# Patient Record
Sex: Male | Born: 1965
Health system: Southern US, Community
[De-identification: ages and names within clinical notes are randomized; demographics above are authoritative.]

## PROBLEM LIST (undated history)

## (undated) DIAGNOSIS — Z8679 Personal history of other diseases of the circulatory system: Secondary | ICD-10-CM

## (undated) DIAGNOSIS — I2585 Chronic coronary microvascular dysfunction: Secondary | ICD-10-CM

## (undated) DIAGNOSIS — E119 Type 2 diabetes mellitus without complications: Secondary | ICD-10-CM

## (undated) DIAGNOSIS — I10 Essential (primary) hypertension: Secondary | ICD-10-CM

## (undated) DIAGNOSIS — G459 Transient cerebral ischemic attack, unspecified: Secondary | ICD-10-CM

## (undated) HISTORY — PX: NECK SURGERY: SHX720

## (undated) HISTORY — DX: Type 2 diabetes mellitus without complications: E11.9

## (undated) HISTORY — PX: BRAIN SURGERY: SHX531

## (undated) HISTORY — DX: Personal history of other diseases of the circulatory system: Z86.79

## (undated) HISTORY — DX: Chronic coronary microvascular dysfunction: I25.85

## (undated) HISTORY — PX: ARTHROPLASTY: SHX135

## (undated) HISTORY — DX: Transient cerebral ischemic attack, unspecified: G45.9

## (undated) HISTORY — PX: ROTATOR CUFF REPAIR: SHX139

---

## 1998-04-06 ENCOUNTER — Encounter: Payer: Self-pay | Admitting: Neurosurgery

## 1998-04-06 ENCOUNTER — Ambulatory Visit (HOSPITAL_COMMUNITY): Admission: RE | Admit: 1998-04-06 | Discharge: 1998-04-06 | Payer: Self-pay | Admitting: Neurosurgery

## 1998-04-10 ENCOUNTER — Encounter: Payer: Self-pay | Admitting: Neurosurgery

## 1998-04-10 ENCOUNTER — Ambulatory Visit (HOSPITAL_COMMUNITY): Admission: RE | Admit: 1998-04-10 | Discharge: 1998-04-10 | Payer: Self-pay | Admitting: Neurosurgery

## 1998-04-18 ENCOUNTER — Inpatient Hospital Stay (HOSPITAL_COMMUNITY): Admission: RE | Admit: 1998-04-18 | Discharge: 1998-04-20 | Payer: Self-pay | Admitting: Neurosurgery

## 1998-05-14 ENCOUNTER — Ambulatory Visit (HOSPITAL_COMMUNITY): Admission: RE | Admit: 1998-05-14 | Discharge: 1998-05-14 | Payer: Self-pay | Admitting: Neurosurgery

## 1998-05-14 ENCOUNTER — Encounter: Payer: Self-pay | Admitting: Neurosurgery

## 2000-09-25 ENCOUNTER — Ambulatory Visit (HOSPITAL_COMMUNITY): Admission: RE | Admit: 2000-09-25 | Discharge: 2000-09-25 | Payer: Self-pay | Admitting: Internal Medicine

## 2000-09-25 ENCOUNTER — Encounter (INDEPENDENT_AMBULATORY_CARE_PROVIDER_SITE_OTHER): Payer: Self-pay | Admitting: Specialist

## 2002-09-23 ENCOUNTER — Encounter (INDEPENDENT_AMBULATORY_CARE_PROVIDER_SITE_OTHER): Payer: Self-pay | Admitting: Specialist

## 2002-09-23 ENCOUNTER — Ambulatory Visit (HOSPITAL_COMMUNITY): Admission: RE | Admit: 2002-09-23 | Discharge: 2002-09-23 | Payer: Self-pay | Admitting: General Surgery

## 2005-03-11 ENCOUNTER — Emergency Department (HOSPITAL_COMMUNITY): Admission: EM | Admit: 2005-03-11 | Discharge: 2005-03-11 | Payer: Self-pay | Admitting: Emergency Medicine

## 2005-10-29 ENCOUNTER — Ambulatory Visit (HOSPITAL_COMMUNITY): Admission: RE | Admit: 2005-10-29 | Discharge: 2005-10-30 | Payer: Self-pay | Admitting: Neurosurgery

## 2008-10-30 ENCOUNTER — Encounter: Admission: RE | Admit: 2008-10-30 | Discharge: 2008-10-30 | Payer: Self-pay | Admitting: Internal Medicine

## 2010-06-28 NOTE — Op Note (Signed)
NAME:  Clarence Larson, Clarence Larson                       ACCOUNT NO.:  [id number]   MEDICAL RECORD NO.:  [id number]                   PATIENT TYPE:  AMB   LOCATION:  DAY                                  FACILITY:  [facility]   PHYSICIAN:  Timothy E. Earlene Plater, M.D.              DATE OF BIRTH:  12/16/1965   DATE OF PROCEDURE:  09/23/2002  DATE OF DISCHARGE:                                 OPERATIVE REPORT   PREOPERATIVE DIAGNOSIS:  Internal hemorrhoids with prolapse.   POSTOPERATIVE DIAGNOSIS:  Internal hemorrhoids with prolapse.   PROCEDURE:  TPH hemorrhoidectomy.   SURGEON:  Timothy E. Earlene Plater, M.D.   ANESTHESIA:  General.   INDICATIONS FOR PROCEDURE:  Mr. Gavia is 82.  He is a robust and athletic  male, who works at a very strenuous job.  He has had mucosal prolapse  syndrome for some years.  He has failed conservative management and after  careful and multiple consultations, he wishes to proceed with a  hemorrhoidectomy at this time.   He was identified and the permit signed, evaluated by anesthesia, laboratory  data reviewed.   DESCRIPTION OF PROCEDURE:  He was taken to the operating room and on the  gurney, general endotracheal anesthesia was induced.  He was then turned  into the prone position, very carefully positioned.  The perianal area was  inspected, prepped, and draped in the usual fashion.  Marcaine 0.25% with  epinephrine mixed 20:1 with Wydase was injected for a wide field block  around the perianal and rectal area.  The anus was then gently dilated to  the operating anoscope.  Prolapse of generous hemorrhoidal tissue was noted.  With scope in place and the anal verge, dentate line carefully measured, a  pursestring suture of 2-0 Prolene was placed between 4-5 cm proximal to the  dentate line.  The anoscope was removed.  This pursestring suture was then  checked with the finger, and then the PPH anoscope was inserted and through  that the open anvil of the PPH stapler was  inserted above the pursestring  suture.  The pursestring suture was snugged up and tied and then brought  through the chamber of the shaft of the PPH staple device.  Then, the PPH  staple device was gently closed completely.  This was held for 60 seconds,  and then it was fired and held for 60 seconds, and then the anvil was opened  one-half turn, and the entire apparatus was removed.  Three minutes was  allowed to elapse, and then careful evaluation of the suture line was  accomplished.  Minor bleeding was seen in 1-2 locations.  One suture was  applied in the right anterior area.  The suture line was satisfactory.  It  was a little lower or closer to the dentate line than I would have expected,  but it was satisfactory.  Five minutes later, all areas were checked for  bleeding  again, and none was present.  With this, the procedure was  complete.  Gelfoam gauze was placed in the anal canal and a dry sterile  dressing externally.  He was awakened, turned back into the supine position,  and then extubated and then taken to recovery.   Careful written and verbal instructions, including Percocet #36 were given,  and he will be seen and followed as an outpatient.                                               Timothy E. Earlene Plater, M.D.    TED/MEDQ  D:  09/23/2002  T:  09/23/2002  Job:  [id number]

## 2010-06-28 NOTE — Procedures (Signed)
[facility]  Patient:    Clarence Larson, Clarence Larson                                Visit Number: [id number] MRN: [id number]          Service Type: END Location: ENDO Attending:  Mervin Hack Adm. Date:  [id number]                             Procedure Report  PROCEDURE:  Colonoscopy and destruction of hemorrhoids.  ENDOSCOPIST:  Hedwig Morton. Juanda Chance, M.D.  INDICATIONS:  This 45 year old white male has a history of colon polyps and in 1993, he also had IRC treatment on hemorrhoids on two different occasions.  He had another colonoscopy in 1998.  He is now undergoing colonoscopy with [place] because he changed insurance.  Patient has intermittent rectal bleeding.  He is interested in having his hemorrhoids treated again because of irritation and bleeding.  ENDOSCOPE:  Olympus single-channel video endoscope.  SEDATION:  Versed 10 mg IV, Demerol 100 mg IV.  FINDINGS:  Olympus single-channel video endoscope passed under direct vision into the rectum to the sigmoid colon.  Patient was monitored by a pulse oximeter; his oxygen saturations were normal.  His prep was excellent.  Anal canal showed large internal hemorrhoids, at least four large bluish hemorrhoids were partially prolapsing through the rectum.  Anal canal itself was normal.  There were scattered diverticula of the sigmoid colon.  At the level of 50 cm from the rectum was a tiny polyp measuring about 5 mm in diameter; it was ablated with hot biopsies.  The transverse colon, hepatic flexure and ascending colon were normal all the way to the cecum.  Cecal pouch and ileocecal valve were unremarkable.  Colonoscope was then retracted and the colon decompressed.  Patient tolerated the procedure well.  As the procedure was terminated, endoscope was then reintroduced into the rectum with a band ligator and four hemorrhoids were banded in the rectum with minimal bleeding and minimal discomfort.  IMPRESSION: 1.  Left colon polyp, status post ablation. 2. Diverticulosis of the left colon. 3. Internal hemorrhoids, status post destruction by banding.  PLAN: 1. Anusol-HC suppositories. 2. High fiber diet. 3. Await results of polyp histology.  If adenomatous, I suggest repeat    colonoscopy in three years. DD:  09/25/00 TD:  09/25/00 Job: [id number]

## 2010-06-28 NOTE — Op Note (Signed)
NAMEGOODWIN, KAMPHAUS             ACCOUNT NO.:  [id number]   MEDICAL RECORD NO.:  [id number]          PATIENT TYPE:  AMB   LOCATION:  SDS                          FACILITY:  [facility]   PHYSICIAN:  Henry A. Pool, M.D.    DATE OF BIRTH:  1965/09/05   DATE OF PROCEDURE:  10/29/2005  DATE OF DISCHARGE:                                 OPERATIVE REPORT   PREOPERATIVE DIAGNOSIS:  C3-4, C4-5, C5-6 and C6-7 spondylosis with stenosis  and myelopathy.   POSTOPERATIVE DIAGNOSIS:  C3-4, C4-5, C5-6 and C6-7 spondylosis with  stenosis and myelopathy.   PROCEDURE:  C3-4, C4-5, C5-6 and C6-7 anterior cervical diskectomy and  fusion with allograft anterior plating.   SURGEON:  Kathaleen Maser. Pool, M.D.   ASSISTANT:  Donalee Citrin, M.D.   ANESTHESIA:  General orotracheal.   INDICATIONS FOR PROCEDURE:  Mr. Batta is a 45 year old male with history  of severe neck and upper extremity symptoms, left much greater than right.  Workups demonstrate evidence of very severe multilevel spondylitic disease  complicated by central disk herniations at C5-6 and C6-7 with spinal cord  compression.  The patient has been counseled as to his options.  He decided  to proceed with a four level anterior cervical diskectomy and fusion with  allograft anterior plating in hopes of improving his symptoms.   DESCRIPTION OF PROCEDURE:  Patient taken to the operating room and placed on  the table in supine position.  After adequate level anesthesia achieved, the  patient with his neck slightly extended and held in place with halter  traction.  The patient's anterior cervical region is prepped and draped  sterilely.  A 10 blade was used to make a linear incision overlying the C4-5  level.  This carried sharply to the platysma.  Platysma then divided  vertically and dissection proceeded on medial border of the  sternocleidomastoid muscle and carotid sheath.  Trachea and esophagus were  mobilized and tracked towards the left.   Prevertebral fascia stripped off  the anterior spinal column.  Longus colli muscle then elevated bilaterally.  Deep self-retaining retractor was placed.  Intraoperative fluoroscopy was  used and the C3-4, C4-5, C5-6 at C6-7 levels were confirmed.  Each disk  space then incised with 15 blade in a rectangular fashion.  A wide disc  space cleanout was then achieved using pituitary rongeurs.  Forward and back  biting Carlen curettes, __________high-speed drill.  Anterior osteophytes  were removed using Leksell rongeur.  Microscope was then brought into the  field and used throughout the remainder of the diskectomy.  Starting first  at C5-6 remaining aspects of annulus and osteophytes were removed using the  high-speed drill down to the level of the posterior longitudinal ligament.  Posterior longitudinal ligament was then elevated and resected in piecemeal  fashion using Kerrison rongeurs.  Underlying thecal sac was identified.  Wide central decompression then performed by undercutting the bodies of C5-  C6.  Decompression proceeded at each neural foramen.  Wide anterior  foraminotomy was then performed along the course of the exiting C6 nerve  roots bilaterally.  Findings at  this level were that of a left paracentral  disk herniation which was completely resected as well as marked spondylosis.   The procedure then repeated C3-4, C4-5 and C6-7.  Findings at C3-4 were of a  significant spondylitic spur off to the right side.  Findings at C4-5 were  of a left preforaminal disk herniation and findings at C6-7 were of a large  central disk herniation.  The wound was then irrigated with antibiotic  solution.  Gelfoam was placed topically for hemostasis which was found to be  good.  The 6 mm Life allograft wedges were placed at C3-4 and C6-7.  The 5  mm grafts were placed at C4-5 and C5-6.  An 80 mm Atlantis anterior cervical  plate was then placed over the C3-C7 levels.  This then attached under   fluoroscopic guidance using 13 mm variable angle screws, two each at all  five levels.  Locking screws then engaged at all five levels.  Final images  revealed good position of bone graft and hardware, proper operative level  and normal alignment of spine.  Wounds then irrigated with antibiotic  solution.  Gelfoam was placed for hemostasis and found to be good.  Microscope __________were removed.  Wound is then closed in typical fashion.  Steri-Strips and sterile dressing were applied.  There no apparent  complications.  The patient tolerated the procedure well and he returns to  recovery room postoperatively.           ______________________________  Kathaleen Maser Pool, M.D.     HAP/MEDQ  D:  10/29/2005  T:  10/30/2005  Job:  [id number]

## 2012-06-10 ENCOUNTER — Emergency Department (HOSPITAL_COMMUNITY)
Admission: EM | Admit: 2012-06-10 | Discharge: 2012-06-10 | Disposition: A | Discharge status: Home / Self Care | Payer: Worker's Compensation | Attending: Emergency Medicine | Admitting: Emergency Medicine

## 2012-06-10 ENCOUNTER — Emergency Department (HOSPITAL_COMMUNITY): Payer: Worker's Compensation

## 2012-06-10 DIAGNOSIS — R209 Unspecified disturbances of skin sensation: Secondary | ICD-10-CM | POA: Insufficient documentation

## 2012-06-10 DIAGNOSIS — S199XXA Unspecified injury of neck, initial encounter: Secondary | ICD-10-CM

## 2012-06-10 DIAGNOSIS — S0003XA Contusion of scalp, initial encounter: Secondary | ICD-10-CM | POA: Insufficient documentation

## 2012-06-10 DIAGNOSIS — S0993XA Unspecified injury of face, initial encounter: Secondary | ICD-10-CM | POA: Insufficient documentation

## 2012-06-10 DIAGNOSIS — Y9389 Activity, other specified: Secondary | ICD-10-CM | POA: Insufficient documentation

## 2012-06-10 DIAGNOSIS — Y9289 Other specified places as the place of occurrence of the external cause: Secondary | ICD-10-CM | POA: Insufficient documentation

## 2012-06-10 DIAGNOSIS — Y990 Civilian activity done for income or pay: Secondary | ICD-10-CM | POA: Insufficient documentation

## 2012-06-10 DIAGNOSIS — S0093XA Contusion of unspecified part of head, initial encounter: Secondary | ICD-10-CM

## 2012-06-10 DIAGNOSIS — W208XXA Other cause of strike by thrown, projected or falling object, initial encounter: Secondary | ICD-10-CM | POA: Insufficient documentation

## 2012-06-10 MED ORDER — CYCLOBENZAPRINE HCL 10 MG PO TABS: 10.0000 mg | ORAL_TABLET | Freq: Two times a day (BID) | ORAL | Status: DC | PRN

## 2012-06-10 MED ORDER — OXYCODONE-ACETAMINOPHEN 5-325 MG PO TABS: 1.0000 | ORAL_TABLET | ORAL | Status: DC | PRN

## 2012-06-10 MED ORDER — OXYCODONE-ACETAMINOPHEN 5-325 MG PO TABS
1.0000 | ORAL_TABLET | Freq: Once | ORAL | Status: AC
  Administered 2012-06-10: 1 via ORAL
  Filled 2012-06-10: qty 1

## 2012-06-10 MED ORDER — IBUPROFEN 800 MG PO TABS: 800.0000 mg | ORAL_TABLET | Freq: Three times a day (TID) | ORAL | Status: DC

## 2012-06-10 MED ORDER — ONDANSETRON 4 MG PO TBDP
4.0000 mg | ORAL_TABLET | Freq: Once | ORAL | Status: AC
  Administered 2012-06-10: 4 mg via ORAL
  Filled 2012-06-10: qty 1

## 2012-06-10 NOTE — ED Notes (Signed)
c-collar applied  

## 2012-06-10 NOTE — ED Provider Notes (Signed)
History    This chart was scribed for Marlon Pel (PA) non-physician practitioner working with Doug Sou, MD by Sofie Rower, ED Scribe. This patient was seen in room TR06C/TR06C and the patient's care was started at 3:12PM.   CSN: [id number]  Arrival date & time 06/10/12  1426   First MD Initiated Contact with Patient 06/10/12 1512      Chief Complaint  Patient presents with  . Head Injury    (Consider location/radiation/quality/duration/timing/severity/associated sxs/prior treatment) The history is provided by the patient. No language interpreter was used.    Clarence Larson is a 47 y.o. male , with C3-C7 neck surgery, who presents to the Emergency Department complaining of sudden, moderate, head injury, onset today (06/10/12).  Associated symptoms include diffuse neck pain and numbness located at the LUE. The pt reports he was at his place of employment ([employer]) earlier today, where he was unloading a package truck and the rear door of the truck, suddenly fell, impacting directly upon the back of his head. The pt informs the height of the door before it fell was at approximately 20 feet, however, there was no LOC during the incident. The pt was able to ambulate at the scene of the incident. The pt has had a c collar applied in triage which provides moderate relief of the neck pain associated with the head injury, in addition to taking ibuprofen PTA, which does not provide relief of the neck pain.   The pt denies visual disturbance, ataxic gait, LOC, and vomiting.      No past medical history on file.  No past surgical history on file.  No family history on file.  History  Substance Use Topics  . Smoking status: Not on file  . Smokeless tobacco: Not on file  . Alcohol Use: Not on file      Review of Systems  HENT: Positive for neck pain.   Eyes: Negative for visual disturbance.  Gastrointestinal: Negative for vomiting.  Musculoskeletal: Positive for arthralgias.  Negative for gait problem.  Neurological: Negative for syncope.  All other systems reviewed and are negative.    Allergies  Hydrocodone  Home Medications   Current Outpatient Rx  Name  Route  Sig  Dispense  Refill  . cyclobenzaprine (FLEXERIL) 10 MG tablet   Oral   Take 1 tablet (10 mg total) by mouth 2 (two) times daily as needed for muscle spasms.   20 tablet   0   . ibuprofen (ADVIL,MOTRIN) 800 MG tablet   Oral   Take 1 tablet (800 mg total) by mouth 3 (three) times daily.   21 tablet   0   . oxyCODONE-acetaminophen (PERCOCET/ROXICET) 5-325 MG per tablet   Oral   Take 1 tablet by mouth every 4 (four) hours as needed for pain.   20 tablet   0     BP 144/95  Pulse 95  Temp(Src) 97.8 F (36.6 C) (Oral)  Resp 20  SpO2 100%  Physical Exam  Nursing note and vitals reviewed. Constitutional: He is oriented to person, place, and time. He appears well-developed and well-nourished. No distress.  HENT:  Head: Normocephalic. Head is with contusion. Head is without raccoon's eyes, without Battle's sign, without abrasion, without laceration, without right periorbital erythema and without left periorbital erythema. Hair is normal.  Eyes: EOM are normal.  Neck: Trachea normal. Neck supple. Spinous process tenderness and muscular tenderness present. No rigidity. Decreased range of motion present. No tracheal deviation, no edema and no  erythema present.  Cardiovascular: Normal rate.   Pulmonary/Chest: Effort normal. No respiratory distress.  Neurological: He is alert and oriented to person, place, and time.  Skin: Skin is warm and dry.  Psychiatric: He has a normal mood and affect. His behavior is normal.    ED Course  Procedures (including critical care time)  DIAGNOSTIC STUDIES: Oxygen Saturation is 100% on room air, normal by my interpretation.    COORDINATION OF CARE:  3:24 PM- Treatment plan discussed with patient. Pt agrees with treatment.   ED Medications given  for this visit (06/10/12):  Medications  oxyCODONE-acetaminophen (PERCOCET/ROXICET) 5-325 MG per tablet 1 tablet (1 tablet Oral Given 06/10/12 1544)  ondansetron (ZOFRAN-ODT) disintegrating tablet 4 mg (4 mg Oral Given 06/10/12 1544)       Labs Reviewed - No data to display  No results found for this or any previous visit. Ct Head Wo Contrast  06/10/2012  *RADIOLOGY REPORT*  Clinical Data: Hand injury, neck pain  CT HEAD WITHOUT CONTRAST,CT CERVICAL SPINE WITHOUT CONTRAST  Technique:  Contiguous axial images were obtained from the base of the skull through the vertex without contrast.,Technique: Multidetector CT imaging of the cervical spine was performed. Multiplanar CT image reconstructions were also generated.  Comparison:  03/11/2005  Findings: No skull fracture is noted.  Again noted prior occipital bone craniotomy.  No intracranial hemorrhage, mass effect or midline shift.  No acute infarction.  No mass lesion is noted on this unenhanced scan. Ventricular size is stable from prior exam.  Paranasal sinuses shows mild mucosal thickening bilateral ethmoid air cells.  There is mucosal thickening left maxillary sinus.  The mastoid air cells are unremarkable.  IMPRESSION:  1.  No acute intracranial abnormality.  No significant change.  CT cervical spine without IV contrast:  Axial images of the cervical spine shows no acute fracture or subluxation.  There is no pneumothorax in visualized lung apices.  Computer processed images shows anterior metallic fixation plate from Z6-X0 C5-C6 and C7 level.  No prevertebral soft tissue swelling. Cervical airway is patent.  Spinal canal is patent.  C1-C2 relationship is unremarkable.  Impression: 1.  Postsurgical changes C3-C7 level.  No acute fracture or subluxation.   Original Report Authenticated By: Natasha Mead, M.D.    Ct Cervical Spine Wo Contrast  06/10/2012  *RADIOLOGY REPORT*  Clinical Data: Hand injury, neck pain  CT HEAD WITHOUT CONTRAST,CT CERVICAL SPINE  WITHOUT CONTRAST  Technique:  Contiguous axial images were obtained from the base of the skull through the vertex without contrast.,Technique: Multidetector CT imaging of the cervical spine was performed. Multiplanar CT image reconstructions were also generated.  Comparison:  03/11/2005  Findings: No skull fracture is noted.  Again noted prior occipital bone craniotomy.  No intracranial hemorrhage, mass effect or midline shift.  No acute infarction.  No mass lesion is noted on this unenhanced scan. Ventricular size is stable from prior exam.  Paranasal sinuses shows mild mucosal thickening bilateral ethmoid air cells.  There is mucosal thickening left maxillary sinus.  The mastoid air cells are unremarkable.  IMPRESSION:  1.  No acute intracranial abnormality.  No significant change.  CT cervical spine without IV contrast:  Axial images of the cervical spine shows no acute fracture or subluxation.  There is no pneumothorax in visualized lung apices.  Computer processed images shows anterior metallic fixation plate from R6-E4 C5-C6 and C7 level.  No prevertebral soft tissue swelling. Cervical airway is patent.  Spinal canal is patent.  C1-C2 relationship  is unremarkable.  Impression: 1.  Postsurgical changes C3-C7 level.  No acute fracture or subluxation.   Original Report Authenticated By: Natasha Mead, M.D.     5:04 PM- Recheck. Treatment plan concerning radiology results and follow up with Neurologist discussed with patient. Pt agrees with treatment.    Discharge orders:    Medication List    TAKE these medications       cyclobenzaprine 10 MG tablet  Commonly known as:  FLEXERIL  Take 1 tablet (10 mg total) by mouth 2 (two) times daily as needed for muscle spasms.     ibuprofen 800 MG tablet  Commonly known as:  ADVIL,MOTRIN  Take 1 tablet (800 mg total) by mouth 3 (three) times daily.     oxyCODONE-acetaminophen 5-325 MG per tablet  Commonly known as:  PERCOCET/ROXICET  Take 1 tablet by mouth  every 4 (four) hours as needed for pain.         1. Head contusion, initial encounter   2. Neck injury, initial encounter       MDM  Due to patients past medical history I stressed need to follow-up with neurologist. 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  Pt has been advised of the symptoms that warrant their return to the ED. Patient has voiced understanding and has agreed to follow-up with the PCP or specialist.  I personally performed the services described in this documentation, which was scribed in my presence. The recorded information has been reviewed and is accurate.   Dorthula Matas, PA-C 06/10/12 2359

## 2012-06-10 NOTE — ED Notes (Signed)
No loc but stunned him bad

## 2012-06-10 NOTE — ED Notes (Signed)
Works for [employer] and was loading truck and door fell hitting pt on top of head now having pain in neck left shoulder going down back c collar appied at triage

## 2012-06-11 NOTE — ED Provider Notes (Signed)
Medical screening examination/treatment/procedure(s) were performed by non-physician practitioner and as supervising physician I was immediately available for consultation/collaboration.  Deairra Halleck, MD 06/11/12 0116 

## 2014-05-18 ENCOUNTER — Institutional Professional Consult (permissible substitution): Payer: Self-pay | Admitting: Neurology

## 2016-08-01 DIAGNOSIS — E291 Testicular hypofunction: Secondary | ICD-10-CM | POA: Diagnosis not present

## 2016-08-01 DIAGNOSIS — E7800 Pure hypercholesterolemia, unspecified: Secondary | ICD-10-CM | POA: Diagnosis not present

## 2016-08-01 DIAGNOSIS — Z79899 Other long term (current) drug therapy: Secondary | ICD-10-CM | POA: Diagnosis not present

## 2016-08-01 DIAGNOSIS — Z125 Encounter for screening for malignant neoplasm of prostate: Secondary | ICD-10-CM | POA: Diagnosis not present

## 2016-08-04 DIAGNOSIS — E669 Obesity, unspecified: Secondary | ICD-10-CM | POA: Diagnosis not present

## 2016-08-04 DIAGNOSIS — F901 Attention-deficit hyperactivity disorder, predominantly hyperactive type: Secondary | ICD-10-CM | POA: Diagnosis not present

## 2016-08-04 DIAGNOSIS — I10 Essential (primary) hypertension: Secondary | ICD-10-CM | POA: Diagnosis not present

## 2016-08-04 DIAGNOSIS — E7800 Pure hypercholesterolemia, unspecified: Secondary | ICD-10-CM | POA: Diagnosis not present

## 2016-08-04 DIAGNOSIS — Z6834 Body mass index (BMI) 34.0-34.9, adult: Secondary | ICD-10-CM | POA: Diagnosis not present

## 2016-08-04 DIAGNOSIS — K219 Gastro-esophageal reflux disease without esophagitis: Secondary | ICD-10-CM | POA: Diagnosis not present

## 2016-08-04 DIAGNOSIS — F5101 Primary insomnia: Secondary | ICD-10-CM | POA: Diagnosis not present

## 2016-08-04 DIAGNOSIS — E291 Testicular hypofunction: Secondary | ICD-10-CM | POA: Diagnosis not present

## 2016-08-12 DIAGNOSIS — L84 Corns and callosities: Secondary | ICD-10-CM | POA: Diagnosis not present

## 2016-09-01 DIAGNOSIS — G894 Chronic pain syndrome: Secondary | ICD-10-CM | POA: Diagnosis not present

## 2016-09-01 DIAGNOSIS — M542 Cervicalgia: Secondary | ICD-10-CM | POA: Diagnosis not present

## 2016-09-29 DIAGNOSIS — M542 Cervicalgia: Secondary | ICD-10-CM | POA: Diagnosis not present

## 2016-09-29 DIAGNOSIS — G894 Chronic pain syndrome: Secondary | ICD-10-CM | POA: Diagnosis not present

## 2016-09-29 DIAGNOSIS — Z79891 Long term (current) use of opiate analgesic: Secondary | ICD-10-CM | POA: Diagnosis not present

## 2016-09-29 DIAGNOSIS — M25511 Pain in right shoulder: Secondary | ICD-10-CM | POA: Diagnosis not present

## 2016-10-07 DIAGNOSIS — M19011 Primary osteoarthritis, right shoulder: Secondary | ICD-10-CM | POA: Diagnosis not present

## 2016-10-07 DIAGNOSIS — M064 Inflammatory polyarthropathy: Secondary | ICD-10-CM | POA: Diagnosis not present

## 2016-10-07 DIAGNOSIS — M25511 Pain in right shoulder: Secondary | ICD-10-CM | POA: Diagnosis not present

## 2016-10-10 DIAGNOSIS — K219 Gastro-esophageal reflux disease without esophagitis: Secondary | ICD-10-CM | POA: Diagnosis not present

## 2016-10-10 DIAGNOSIS — I10 Essential (primary) hypertension: Secondary | ICD-10-CM | POA: Diagnosis not present

## 2016-10-10 DIAGNOSIS — R0689 Other abnormalities of breathing: Secondary | ICD-10-CM | POA: Diagnosis not present

## 2016-10-15 DIAGNOSIS — Z0000 Encounter for general adult medical examination without abnormal findings: Secondary | ICD-10-CM | POA: Diagnosis not present

## 2016-10-15 DIAGNOSIS — E669 Obesity, unspecified: Secondary | ICD-10-CM | POA: Diagnosis not present

## 2016-10-15 DIAGNOSIS — Z6833 Body mass index (BMI) 33.0-33.9, adult: Secondary | ICD-10-CM | POA: Diagnosis not present

## 2016-10-31 DIAGNOSIS — G894 Chronic pain syndrome: Secondary | ICD-10-CM | POA: Diagnosis not present

## 2016-10-31 DIAGNOSIS — M25511 Pain in right shoulder: Secondary | ICD-10-CM | POA: Diagnosis not present

## 2016-10-31 DIAGNOSIS — Z79891 Long term (current) use of opiate analgesic: Secondary | ICD-10-CM | POA: Diagnosis not present

## 2016-10-31 DIAGNOSIS — M542 Cervicalgia: Secondary | ICD-10-CM | POA: Diagnosis not present

## 2016-11-15 DIAGNOSIS — J209 Acute bronchitis, unspecified: Secondary | ICD-10-CM | POA: Diagnosis not present

## 2016-11-15 DIAGNOSIS — R0600 Dyspnea, unspecified: Secondary | ICD-10-CM | POA: Diagnosis not present

## 2016-11-15 DIAGNOSIS — B349 Viral infection, unspecified: Secondary | ICD-10-CM | POA: Diagnosis not present

## 2016-11-15 DIAGNOSIS — R0602 Shortness of breath: Secondary | ICD-10-CM | POA: Diagnosis not present

## 2016-11-18 DIAGNOSIS — M19011 Primary osteoarthritis, right shoulder: Secondary | ICD-10-CM | POA: Diagnosis not present

## 2016-11-18 DIAGNOSIS — M25511 Pain in right shoulder: Secondary | ICD-10-CM | POA: Diagnosis not present

## 2016-11-20 DIAGNOSIS — J4540 Moderate persistent asthma, uncomplicated: Secondary | ICD-10-CM | POA: Diagnosis not present

## 2016-11-20 DIAGNOSIS — F901 Attention-deficit hyperactivity disorder, predominantly hyperactive type: Secondary | ICD-10-CM | POA: Diagnosis not present

## 2016-11-28 DIAGNOSIS — M542 Cervicalgia: Secondary | ICD-10-CM | POA: Diagnosis not present

## 2016-11-28 DIAGNOSIS — Z79891 Long term (current) use of opiate analgesic: Secondary | ICD-10-CM | POA: Diagnosis not present

## 2016-11-28 DIAGNOSIS — M25511 Pain in right shoulder: Secondary | ICD-10-CM | POA: Diagnosis not present

## 2016-11-28 DIAGNOSIS — G894 Chronic pain syndrome: Secondary | ICD-10-CM | POA: Diagnosis not present

## 2016-12-17 DIAGNOSIS — Z23 Encounter for immunization: Secondary | ICD-10-CM | POA: Diagnosis not present

## 2016-12-21 DIAGNOSIS — G894 Chronic pain syndrome: Secondary | ICD-10-CM | POA: Diagnosis not present

## 2016-12-21 DIAGNOSIS — I10 Essential (primary) hypertension: Secondary | ICD-10-CM | POA: Diagnosis not present

## 2016-12-21 DIAGNOSIS — F11920 Opioid use, unspecified with intoxication, uncomplicated: Secondary | ICD-10-CM | POA: Diagnosis not present

## 2016-12-21 DIAGNOSIS — R51 Headache: Secondary | ICD-10-CM | POA: Diagnosis not present

## 2016-12-21 DIAGNOSIS — Z79891 Long term (current) use of opiate analgesic: Secondary | ICD-10-CM | POA: Diagnosis not present

## 2016-12-22 DIAGNOSIS — F1123 Opioid dependence with withdrawal: Secondary | ICD-10-CM | POA: Diagnosis not present

## 2016-12-22 DIAGNOSIS — F19939 Other psychoactive substance use, unspecified with withdrawal, unspecified: Secondary | ICD-10-CM | POA: Diagnosis not present

## 2016-12-23 DIAGNOSIS — Z6834 Body mass index (BMI) 34.0-34.9, adult: Secondary | ICD-10-CM | POA: Diagnosis not present

## 2016-12-23 DIAGNOSIS — E669 Obesity, unspecified: Secondary | ICD-10-CM | POA: Diagnosis not present

## 2016-12-23 DIAGNOSIS — I10 Essential (primary) hypertension: Secondary | ICD-10-CM | POA: Diagnosis not present

## 2016-12-26 DIAGNOSIS — M542 Cervicalgia: Secondary | ICD-10-CM | POA: Diagnosis not present

## 2016-12-26 DIAGNOSIS — M25511 Pain in right shoulder: Secondary | ICD-10-CM | POA: Diagnosis not present

## 2016-12-26 DIAGNOSIS — G894 Chronic pain syndrome: Secondary | ICD-10-CM | POA: Diagnosis not present

## 2016-12-26 DIAGNOSIS — Z79891 Long term (current) use of opiate analgesic: Secondary | ICD-10-CM | POA: Diagnosis not present

## 2016-12-30 DIAGNOSIS — M064 Inflammatory polyarthropathy: Secondary | ICD-10-CM | POA: Diagnosis not present

## 2016-12-30 DIAGNOSIS — M19011 Primary osteoarthritis, right shoulder: Secondary | ICD-10-CM | POA: Diagnosis not present

## 2016-12-30 DIAGNOSIS — M25511 Pain in right shoulder: Secondary | ICD-10-CM | POA: Diagnosis not present

## 2017-01-07 DIAGNOSIS — I10 Essential (primary) hypertension: Secondary | ICD-10-CM | POA: Diagnosis not present

## 2017-01-07 DIAGNOSIS — J029 Acute pharyngitis, unspecified: Secondary | ICD-10-CM | POA: Diagnosis not present

## 2017-01-10 DIAGNOSIS — I10 Essential (primary) hypertension: Secondary | ICD-10-CM | POA: Diagnosis not present

## 2017-01-10 DIAGNOSIS — E7800 Pure hypercholesterolemia, unspecified: Secondary | ICD-10-CM | POA: Diagnosis not present

## 2017-01-10 DIAGNOSIS — R079 Chest pain, unspecified: Secondary | ICD-10-CM | POA: Diagnosis not present

## 2017-01-10 DIAGNOSIS — R0602 Shortness of breath: Secondary | ICD-10-CM | POA: Diagnosis not present

## 2017-01-13 DIAGNOSIS — I10 Essential (primary) hypertension: Secondary | ICD-10-CM | POA: Diagnosis not present

## 2017-01-13 DIAGNOSIS — T402X5D Adverse effect of other opioids, subsequent encounter: Secondary | ICD-10-CM | POA: Diagnosis not present

## 2017-01-13 DIAGNOSIS — M542 Cervicalgia: Secondary | ICD-10-CM | POA: Diagnosis not present

## 2017-01-28 DIAGNOSIS — M25511 Pain in right shoulder: Secondary | ICD-10-CM | POA: Diagnosis not present

## 2017-01-28 DIAGNOSIS — M542 Cervicalgia: Secondary | ICD-10-CM | POA: Diagnosis not present

## 2017-01-28 DIAGNOSIS — G894 Chronic pain syndrome: Secondary | ICD-10-CM | POA: Diagnosis not present

## 2017-01-28 DIAGNOSIS — Z79891 Long term (current) use of opiate analgesic: Secondary | ICD-10-CM | POA: Diagnosis not present

## 2017-02-12 DIAGNOSIS — S46812A Strain of other muscles, fascia and tendons at shoulder and upper arm level, left arm, initial encounter: Secondary | ICD-10-CM | POA: Diagnosis not present

## 2017-02-20 DIAGNOSIS — M24112 Other articular cartilage disorders, left shoulder: Secondary | ICD-10-CM | POA: Diagnosis not present

## 2017-02-20 DIAGNOSIS — M75112 Incomplete rotator cuff tear or rupture of left shoulder, not specified as traumatic: Secondary | ICD-10-CM | POA: Diagnosis not present

## 2017-02-20 DIAGNOSIS — M19012 Primary osteoarthritis, left shoulder: Secondary | ICD-10-CM | POA: Diagnosis not present

## 2017-02-20 DIAGNOSIS — M7582 Other shoulder lesions, left shoulder: Secondary | ICD-10-CM | POA: Diagnosis not present

## 2017-02-24 DIAGNOSIS — S46812A Strain of other muscles, fascia and tendons at shoulder and upper arm level, left arm, initial encounter: Secondary | ICD-10-CM | POA: Diagnosis not present

## 2017-03-02 DIAGNOSIS — Z79891 Long term (current) use of opiate analgesic: Secondary | ICD-10-CM | POA: Diagnosis not present

## 2017-03-02 DIAGNOSIS — M25511 Pain in right shoulder: Secondary | ICD-10-CM | POA: Diagnosis not present

## 2017-03-02 DIAGNOSIS — M542 Cervicalgia: Secondary | ICD-10-CM | POA: Diagnosis not present

## 2017-03-02 DIAGNOSIS — G894 Chronic pain syndrome: Secondary | ICD-10-CM | POA: Diagnosis not present

## 2017-03-06 DIAGNOSIS — R071 Chest pain on breathing: Secondary | ICD-10-CM | POA: Diagnosis not present

## 2017-03-06 DIAGNOSIS — R079 Chest pain, unspecified: Secondary | ICD-10-CM | POA: Diagnosis not present

## 2017-03-06 DIAGNOSIS — E871 Hypo-osmolality and hyponatremia: Secondary | ICD-10-CM | POA: Diagnosis not present

## 2017-03-06 DIAGNOSIS — R0602 Shortness of breath: Secondary | ICD-10-CM | POA: Diagnosis not present

## 2017-03-06 DIAGNOSIS — G894 Chronic pain syndrome: Secondary | ICD-10-CM | POA: Diagnosis not present

## 2017-03-06 DIAGNOSIS — S46812A Strain of other muscles, fascia and tendons at shoulder and upper arm level, left arm, initial encounter: Secondary | ICD-10-CM | POA: Diagnosis not present

## 2017-03-06 DIAGNOSIS — M75102 Unspecified rotator cuff tear or rupture of left shoulder, not specified as traumatic: Secondary | ICD-10-CM | POA: Diagnosis not present

## 2017-03-06 DIAGNOSIS — G8918 Other acute postprocedural pain: Secondary | ICD-10-CM | POA: Diagnosis not present

## 2017-03-06 DIAGNOSIS — Z87891 Personal history of nicotine dependence: Secondary | ICD-10-CM | POA: Diagnosis not present

## 2017-03-06 DIAGNOSIS — M7552 Bursitis of left shoulder: Secondary | ICD-10-CM | POA: Diagnosis not present

## 2017-03-06 DIAGNOSIS — E785 Hyperlipidemia, unspecified: Secondary | ICD-10-CM | POA: Diagnosis not present

## 2017-03-06 DIAGNOSIS — I10 Essential (primary) hypertension: Secondary | ICD-10-CM | POA: Diagnosis not present

## 2017-03-06 DIAGNOSIS — M75122 Complete rotator cuff tear or rupture of left shoulder, not specified as traumatic: Secondary | ICD-10-CM | POA: Diagnosis not present

## 2017-03-06 DIAGNOSIS — M62122 Other rupture of muscle (nontraumatic), left upper arm: Secondary | ICD-10-CM | POA: Diagnosis not present

## 2017-03-06 DIAGNOSIS — M66322 Spontaneous rupture of flexor tendons, left upper arm: Secondary | ICD-10-CM | POA: Diagnosis not present

## 2017-03-06 DIAGNOSIS — E039 Hypothyroidism, unspecified: Secondary | ICD-10-CM | POA: Diagnosis not present

## 2017-03-06 DIAGNOSIS — J986 Disorders of diaphragm: Secondary | ICD-10-CM | POA: Diagnosis not present

## 2017-03-06 DIAGNOSIS — R0781 Pleurodynia: Secondary | ICD-10-CM | POA: Diagnosis not present

## 2017-03-06 DIAGNOSIS — M7542 Impingement syndrome of left shoulder: Secondary | ICD-10-CM | POA: Diagnosis not present

## 2017-03-06 DIAGNOSIS — E7800 Pure hypercholesterolemia, unspecified: Secondary | ICD-10-CM | POA: Diagnosis not present

## 2017-03-06 DIAGNOSIS — J181 Lobar pneumonia, unspecified organism: Secondary | ICD-10-CM | POA: Diagnosis not present

## 2017-03-06 DIAGNOSIS — G8929 Other chronic pain: Secondary | ICD-10-CM | POA: Diagnosis not present

## 2017-03-06 DIAGNOSIS — Z79899 Other long term (current) drug therapy: Secondary | ICD-10-CM | POA: Diagnosis not present

## 2017-03-07 DIAGNOSIS — R0602 Shortness of breath: Secondary | ICD-10-CM | POA: Diagnosis not present

## 2017-03-07 DIAGNOSIS — M75102 Unspecified rotator cuff tear or rupture of left shoulder, not specified as traumatic: Secondary | ICD-10-CM | POA: Diagnosis not present

## 2017-03-07 DIAGNOSIS — R079 Chest pain, unspecified: Secondary | ICD-10-CM | POA: Diagnosis not present

## 2017-03-07 DIAGNOSIS — I10 Essential (primary) hypertension: Secondary | ICD-10-CM | POA: Diagnosis not present

## 2017-03-07 DIAGNOSIS — E785 Hyperlipidemia, unspecified: Secondary | ICD-10-CM | POA: Diagnosis not present

## 2017-03-07 DIAGNOSIS — E871 Hypo-osmolality and hyponatremia: Secondary | ICD-10-CM | POA: Diagnosis not present

## 2017-03-07 DIAGNOSIS — J181 Lobar pneumonia, unspecified organism: Secondary | ICD-10-CM | POA: Diagnosis not present

## 2017-03-09 DIAGNOSIS — S46812D Strain of other muscles, fascia and tendons at shoulder and upper arm level, left arm, subsequent encounter: Secondary | ICD-10-CM | POA: Diagnosis not present

## 2017-03-09 DIAGNOSIS — M25512 Pain in left shoulder: Secondary | ICD-10-CM | POA: Diagnosis not present

## 2017-03-09 DIAGNOSIS — M6281 Muscle weakness (generalized): Secondary | ICD-10-CM | POA: Diagnosis not present

## 2017-03-12 ENCOUNTER — Other Ambulatory Visit: Payer: Self-pay | Admitting: *Deleted

## 2017-03-12 DIAGNOSIS — J4540 Moderate persistent asthma, uncomplicated: Secondary | ICD-10-CM | POA: Diagnosis not present

## 2017-03-12 DIAGNOSIS — K219 Gastro-esophageal reflux disease without esophagitis: Secondary | ICD-10-CM | POA: Diagnosis not present

## 2017-03-12 DIAGNOSIS — I10 Essential (primary) hypertension: Secondary | ICD-10-CM | POA: Diagnosis not present

## 2017-03-12 DIAGNOSIS — F901 Attention-deficit hyperactivity disorder, predominantly hyperactive type: Secondary | ICD-10-CM | POA: Diagnosis not present

## 2017-03-12 DIAGNOSIS — Z79899 Other long term (current) drug therapy: Secondary | ICD-10-CM | POA: Diagnosis not present

## 2017-03-12 DIAGNOSIS — E291 Testicular hypofunction: Secondary | ICD-10-CM | POA: Diagnosis not present

## 2017-03-12 NOTE — Patient Outreach (Signed)
[facility] [facility]) Care Management  03/12/2017  Clarence Larson [birth year] [id number]  Referral from [place]; recently discharged from [facility] 03/08/2017.  Telephone to patient who was advised of reason for call & [facility] care management services. HIPPA verification received from patient.  Patient voices he had outpatient surgery on shoulder but had to be admitted because of a complication. States he had hospital follow up appointment with Primary care provider today.   Voices that he has all of prescriptions filled.  Patient voices he is having shoulder pain and also has chronic pain. Voices he goes to Pain Management clinic.   Patient agreed to answer "TOC" assessment but unable to talk further.  Plan: Will follow up.   Sherrin Daisy, RN BSN [place] Management Coordinator [facility] Care Management  [phone]

## 2017-03-13 DIAGNOSIS — S46812D Strain of other muscles, fascia and tendons at shoulder and upper arm level, left arm, subsequent encounter: Secondary | ICD-10-CM | POA: Diagnosis not present

## 2017-03-13 DIAGNOSIS — M6281 Muscle weakness (generalized): Secondary | ICD-10-CM | POA: Diagnosis not present

## 2017-03-13 DIAGNOSIS — M25512 Pain in left shoulder: Secondary | ICD-10-CM | POA: Diagnosis not present

## 2017-03-17 ENCOUNTER — Other Ambulatory Visit: Payer: Self-pay | Admitting: *Deleted

## 2017-03-17 DIAGNOSIS — S46812D Strain of other muscles, fascia and tendons at shoulder and upper arm level, left arm, subsequent encounter: Secondary | ICD-10-CM | POA: Diagnosis not present

## 2017-03-17 DIAGNOSIS — M6281 Muscle weakness (generalized): Secondary | ICD-10-CM | POA: Diagnosis not present

## 2017-03-17 DIAGNOSIS — M25512 Pain in left shoulder: Secondary | ICD-10-CM | POA: Diagnosis not present

## 2017-03-17 NOTE — Patient Outreach (Signed)
[place] [facility]) Care Management  03/17/2017  Clarence Larson 02-22-1965 [id number]  Telephone to patient who was advised of reason for call & [facility] care management services. HIPPA verification received from patient.   Return call to patient for [facility] #2 call.  No answer; left message on voice mail.   Plan: Will follow up.  Sherrin Daisy, RN BSN [place] Management Coordinator [facility] Care Management  [phone]

## 2017-03-18 ENCOUNTER — Other Ambulatory Visit: Payer: Self-pay | Admitting: *Deleted

## 2017-03-18 NOTE — Patient Outreach (Signed)
[place] [facility]) Care Management  03/18/2017  Clarence Larson 1965/12/15 [id number]   Referral from [place]; recently discharged from [facility] 03/08/2017.  Telephone call attempt x 2; patient unable to talk now. Wants call back tomorrow.   Plan: Geophysicist/field seismologist. Will follow up.  Sherrin Daisy, RN BSN [place] Management Coordinator [facility] Care Management  [phone]

## 2017-03-19 ENCOUNTER — Encounter: Payer: Self-pay | Admitting: *Deleted

## 2017-03-19 ENCOUNTER — Other Ambulatory Visit: Payer: Self-pay | Admitting: *Deleted

## 2017-03-19 NOTE — Patient Outreach (Signed)
[place] [facility]) Care Management  03/19/2017  [name] 07/11/1965 [id number]  Telephone/Transition of care call x 3 attempt- left voice message.  Plan: Will send outreach letter. Will follow up.  Sherrin Daisy, RN BSN [place] Management Coordinator [facility] Care Management  [phone]

## 2017-03-20 DIAGNOSIS — M25512 Pain in left shoulder: Secondary | ICD-10-CM | POA: Diagnosis not present

## 2017-03-20 DIAGNOSIS — M6281 Muscle weakness (generalized): Secondary | ICD-10-CM | POA: Diagnosis not present

## 2017-03-20 DIAGNOSIS — S46812D Strain of other muscles, fascia and tendons at shoulder and upper arm level, left arm, subsequent encounter: Secondary | ICD-10-CM | POA: Diagnosis not present

## 2017-03-24 DIAGNOSIS — Z79899 Other long term (current) drug therapy: Secondary | ICD-10-CM | POA: Diagnosis not present

## 2017-03-24 DIAGNOSIS — I10 Essential (primary) hypertension: Secondary | ICD-10-CM | POA: Diagnosis not present

## 2017-03-25 DIAGNOSIS — M6281 Muscle weakness (generalized): Secondary | ICD-10-CM | POA: Diagnosis not present

## 2017-03-25 DIAGNOSIS — M25512 Pain in left shoulder: Secondary | ICD-10-CM | POA: Diagnosis not present

## 2017-03-25 DIAGNOSIS — S46812D Strain of other muscles, fascia and tendons at shoulder and upper arm level, left arm, subsequent encounter: Secondary | ICD-10-CM | POA: Diagnosis not present

## 2017-03-27 DIAGNOSIS — M25512 Pain in left shoulder: Secondary | ICD-10-CM | POA: Diagnosis not present

## 2017-03-27 DIAGNOSIS — M6281 Muscle weakness (generalized): Secondary | ICD-10-CM | POA: Diagnosis not present

## 2017-03-27 DIAGNOSIS — S46812D Strain of other muscles, fascia and tendons at shoulder and upper arm level, left arm, subsequent encounter: Secondary | ICD-10-CM | POA: Diagnosis not present

## 2017-03-30 DIAGNOSIS — G894 Chronic pain syndrome: Secondary | ICD-10-CM | POA: Diagnosis not present

## 2017-03-30 DIAGNOSIS — Z79891 Long term (current) use of opiate analgesic: Secondary | ICD-10-CM | POA: Diagnosis not present

## 2017-03-30 DIAGNOSIS — M542 Cervicalgia: Secondary | ICD-10-CM | POA: Diagnosis not present

## 2017-03-30 DIAGNOSIS — M25511 Pain in right shoulder: Secondary | ICD-10-CM | POA: Diagnosis not present

## 2017-03-31 DIAGNOSIS — M6281 Muscle weakness (generalized): Secondary | ICD-10-CM | POA: Diagnosis not present

## 2017-03-31 DIAGNOSIS — M25512 Pain in left shoulder: Secondary | ICD-10-CM | POA: Diagnosis not present

## 2017-03-31 DIAGNOSIS — S46812D Strain of other muscles, fascia and tendons at shoulder and upper arm level, left arm, subsequent encounter: Secondary | ICD-10-CM | POA: Diagnosis not present

## 2017-04-02 ENCOUNTER — Encounter: Payer: Self-pay | Admitting: *Deleted

## 2017-04-02 ENCOUNTER — Other Ambulatory Visit: Payer: Self-pay | Admitting: *Deleted

## 2017-04-02 DIAGNOSIS — M6281 Muscle weakness (generalized): Secondary | ICD-10-CM | POA: Diagnosis not present

## 2017-04-02 DIAGNOSIS — S46812D Strain of other muscles, fascia and tendons at shoulder and upper arm level, left arm, subsequent encounter: Secondary | ICD-10-CM | POA: Diagnosis not present

## 2017-04-02 DIAGNOSIS — M25512 Pain in left shoulder: Secondary | ICD-10-CM | POA: Diagnosis not present

## 2017-04-02 NOTE — Patient Outreach (Signed)
[place] [facility]) Care Management  04/02/2017  Clarence Larson 08-17-1965 [id number]  Telephone call to patient 10 business days after sending outreach letter. HIPPa verification received from patient. Advised of [facility] services.  Patient voices that he is doing well. States he is driving now & attends outpatient physical therapy twice weekly. States he is taking medications as instructed. States he has support from friend. States he does not need [facility] services at this time.  Plan: Send MD closure letter. Send to care management assistant for case closure.  Sherrin Daisy, RN BSN [place] Management Coordinator [facility] Care Management  [phone]

## 2017-04-02 NOTE — Patient Outreach (Deleted)
[place] [facility]) Care Management  04/02/2017  Clarence Larson 06-Oct-1965 [id number]  Telephone call following 10 business days of sending outreach letter.   Patient states he is doing well. States he is receiving outpatient therapy twice weekly. Voices he is able to drive to appointments. Voices taking medications as prescribed.  States friend offers him support as needed.  Patient states he does not need [facility] services at this time.   Plan: Send MD closure letter. Send to care management assistant to close case.  Sherrin Daisy, RN BSN [place] Management Coordinator [facility] Care Management  [phone]

## 2017-04-04 ENCOUNTER — Emergency Department (HOSPITAL_COMMUNITY): Payer: PPO

## 2017-04-04 ENCOUNTER — Encounter (HOSPITAL_COMMUNITY): Payer: Self-pay | Admitting: Emergency Medicine

## 2017-04-04 ENCOUNTER — Other Ambulatory Visit: Payer: Self-pay

## 2017-04-04 ENCOUNTER — Emergency Department (HOSPITAL_COMMUNITY)
Admission: EM | Admit: 2017-04-04 | Discharge: 2017-04-04 | Disposition: A | Discharge status: Home / Self Care | Payer: PPO | Attending: Emergency Medicine | Admitting: Emergency Medicine

## 2017-04-04 DIAGNOSIS — Z5321 Procedure and treatment not carried out due to patient leaving prior to being seen by health care provider: Secondary | ICD-10-CM | POA: Diagnosis not present

## 2017-04-04 DIAGNOSIS — R0602 Shortness of breath: Secondary | ICD-10-CM | POA: Insufficient documentation

## 2017-04-04 HISTORY — DX: Essential (primary) hypertension: I10

## 2017-04-04 LAB — BASIC METABOLIC PANEL
Anion gap: 13 (ref 5–15)
BUN: 16 mg/dL (ref 6–20)
CALCIUM: 9.3 mg/dL (ref 8.9–10.3)
CO2: 24 mmol/L (ref 22–32)
CREATININE: 1.19 mg/dL (ref 0.61–1.24)
Chloride: 95 mmol/L — ABNORMAL LOW (ref 101–111)
GFR calc non Af Amer: >60 mL/min (ref >60)
Glucose, Bld: 120 mg/dL — ABNORMAL HIGH (ref 65–99)
Potassium: 3.8 mmol/L (ref 3.5–5.1)
SODIUM: 132 mmol/L — AB (ref 135–145)

## 2017-04-04 LAB — I-STAT TROPONIN, ED: TROPONIN I, POC: 0 ng/mL (ref 0.00–0.08)

## 2017-04-04 LAB — CBC
HCT: 46.3 % (ref 39.0–52.0)
Hemoglobin: 15.6 g/dL (ref 13.0–17.0)
MCH: 28.3 pg (ref 26.0–34.0)
MCHC: 33.7 g/dL (ref 30.0–36.0)
MCV: 83.9 fL (ref 78.0–100.0)
PLATELETS: 165 10*3/uL (ref 150–400)
RBC: 5.52 MIL/uL (ref 4.22–5.81)
RDW: 14.9 % (ref 11.5–15.5)
WBC: 6.6 10*3/uL (ref 4.0–10.5)

## 2017-04-04 NOTE — ED Triage Notes (Signed)
Pt c/o shortness of breath that started tonight. Rotator cuff surgery done 4 weeks ago, hx collapsed lung. SpO2 96% room air. LS clear.

## 2017-04-04 NOTE — ED Notes (Signed)
Pt has decided to leave AMA. 

## 2017-04-06 DIAGNOSIS — S46812D Strain of other muscles, fascia and tendons at shoulder and upper arm level, left arm, subsequent encounter: Secondary | ICD-10-CM | POA: Diagnosis not present

## 2017-04-06 DIAGNOSIS — M25512 Pain in left shoulder: Secondary | ICD-10-CM | POA: Diagnosis not present

## 2017-04-06 DIAGNOSIS — M6281 Muscle weakness (generalized): Secondary | ICD-10-CM | POA: Diagnosis not present

## 2017-04-09 DIAGNOSIS — S46812D Strain of other muscles, fascia and tendons at shoulder and upper arm level, left arm, subsequent encounter: Secondary | ICD-10-CM | POA: Diagnosis not present

## 2017-04-09 DIAGNOSIS — M25512 Pain in left shoulder: Secondary | ICD-10-CM | POA: Diagnosis not present

## 2017-04-09 DIAGNOSIS — M6281 Muscle weakness (generalized): Secondary | ICD-10-CM | POA: Diagnosis not present

## 2017-04-14 DIAGNOSIS — S46812D Strain of other muscles, fascia and tendons at shoulder and upper arm level, left arm, subsequent encounter: Secondary | ICD-10-CM | POA: Diagnosis not present

## 2017-04-14 DIAGNOSIS — M6281 Muscle weakness (generalized): Secondary | ICD-10-CM | POA: Diagnosis not present

## 2017-04-14 DIAGNOSIS — M25512 Pain in left shoulder: Secondary | ICD-10-CM | POA: Diagnosis not present

## 2017-04-16 DIAGNOSIS — M25512 Pain in left shoulder: Secondary | ICD-10-CM | POA: Diagnosis not present

## 2017-04-16 DIAGNOSIS — S46812D Strain of other muscles, fascia and tendons at shoulder and upper arm level, left arm, subsequent encounter: Secondary | ICD-10-CM | POA: Diagnosis not present

## 2017-04-16 DIAGNOSIS — M6281 Muscle weakness (generalized): Secondary | ICD-10-CM | POA: Diagnosis not present

## 2017-04-20 DIAGNOSIS — D492 Neoplasm of unspecified behavior of bone, soft tissue, and skin: Secondary | ICD-10-CM | POA: Diagnosis not present

## 2017-04-21 DIAGNOSIS — M25512 Pain in left shoulder: Secondary | ICD-10-CM | POA: Diagnosis not present

## 2017-04-21 DIAGNOSIS — S46812D Strain of other muscles, fascia and tendons at shoulder and upper arm level, left arm, subsequent encounter: Secondary | ICD-10-CM | POA: Diagnosis not present

## 2017-04-21 DIAGNOSIS — M6281 Muscle weakness (generalized): Secondary | ICD-10-CM | POA: Diagnosis not present

## 2017-04-23 DIAGNOSIS — M25512 Pain in left shoulder: Secondary | ICD-10-CM | POA: Diagnosis not present

## 2017-04-23 DIAGNOSIS — S46812D Strain of other muscles, fascia and tendons at shoulder and upper arm level, left arm, subsequent encounter: Secondary | ICD-10-CM | POA: Diagnosis not present

## 2017-04-23 DIAGNOSIS — M6281 Muscle weakness (generalized): Secondary | ICD-10-CM | POA: Diagnosis not present

## 2017-04-27 DIAGNOSIS — G894 Chronic pain syndrome: Secondary | ICD-10-CM | POA: Diagnosis not present

## 2017-04-27 DIAGNOSIS — M25512 Pain in left shoulder: Secondary | ICD-10-CM | POA: Diagnosis not present

## 2017-04-27 DIAGNOSIS — M25511 Pain in right shoulder: Secondary | ICD-10-CM | POA: Diagnosis not present

## 2017-04-27 DIAGNOSIS — Z79891 Long term (current) use of opiate analgesic: Secondary | ICD-10-CM | POA: Diagnosis not present

## 2017-04-27 DIAGNOSIS — M542 Cervicalgia: Secondary | ICD-10-CM | POA: Diagnosis not present

## 2017-04-28 DIAGNOSIS — M6281 Muscle weakness (generalized): Secondary | ICD-10-CM | POA: Diagnosis not present

## 2017-04-28 DIAGNOSIS — M25512 Pain in left shoulder: Secondary | ICD-10-CM | POA: Diagnosis not present

## 2017-04-28 DIAGNOSIS — S46812D Strain of other muscles, fascia and tendons at shoulder and upper arm level, left arm, subsequent encounter: Secondary | ICD-10-CM | POA: Diagnosis not present

## 2017-04-29 DIAGNOSIS — J4521 Mild intermittent asthma with (acute) exacerbation: Secondary | ICD-10-CM | POA: Diagnosis not present

## 2017-05-01 DIAGNOSIS — S46812D Strain of other muscles, fascia and tendons at shoulder and upper arm level, left arm, subsequent encounter: Secondary | ICD-10-CM | POA: Diagnosis not present

## 2017-05-01 DIAGNOSIS — M25512 Pain in left shoulder: Secondary | ICD-10-CM | POA: Diagnosis not present

## 2017-05-01 DIAGNOSIS — M6281 Muscle weakness (generalized): Secondary | ICD-10-CM | POA: Diagnosis not present

## 2017-05-04 DIAGNOSIS — S46812D Strain of other muscles, fascia and tendons at shoulder and upper arm level, left arm, subsequent encounter: Secondary | ICD-10-CM | POA: Diagnosis not present

## 2017-05-04 DIAGNOSIS — M25512 Pain in left shoulder: Secondary | ICD-10-CM | POA: Diagnosis not present

## 2017-05-04 DIAGNOSIS — M6281 Muscle weakness (generalized): Secondary | ICD-10-CM | POA: Diagnosis not present

## 2017-05-08 DIAGNOSIS — M25512 Pain in left shoulder: Secondary | ICD-10-CM | POA: Diagnosis not present

## 2017-05-08 DIAGNOSIS — S46812D Strain of other muscles, fascia and tendons at shoulder and upper arm level, left arm, subsequent encounter: Secondary | ICD-10-CM | POA: Diagnosis not present

## 2017-05-08 DIAGNOSIS — M6281 Muscle weakness (generalized): Secondary | ICD-10-CM | POA: Diagnosis not present

## 2017-05-11 DIAGNOSIS — S46812D Strain of other muscles, fascia and tendons at shoulder and upper arm level, left arm, subsequent encounter: Secondary | ICD-10-CM | POA: Diagnosis not present

## 2017-05-11 DIAGNOSIS — M25512 Pain in left shoulder: Secondary | ICD-10-CM | POA: Diagnosis not present

## 2017-05-11 DIAGNOSIS — M6281 Muscle weakness (generalized): Secondary | ICD-10-CM | POA: Diagnosis not present

## 2017-05-13 DIAGNOSIS — S46812D Strain of other muscles, fascia and tendons at shoulder and upper arm level, left arm, subsequent encounter: Secondary | ICD-10-CM | POA: Diagnosis not present

## 2017-05-13 DIAGNOSIS — M25512 Pain in left shoulder: Secondary | ICD-10-CM | POA: Diagnosis not present

## 2017-05-13 DIAGNOSIS — M6281 Muscle weakness (generalized): Secondary | ICD-10-CM | POA: Diagnosis not present

## 2017-05-14 DIAGNOSIS — F901 Attention-deficit hyperactivity disorder, predominantly hyperactive type: Secondary | ICD-10-CM | POA: Diagnosis not present

## 2017-05-14 DIAGNOSIS — I10 Essential (primary) hypertension: Secondary | ICD-10-CM | POA: Diagnosis not present

## 2017-05-14 DIAGNOSIS — E669 Obesity, unspecified: Secondary | ICD-10-CM | POA: Diagnosis not present

## 2017-05-14 DIAGNOSIS — Z6831 Body mass index (BMI) 31.0-31.9, adult: Secondary | ICD-10-CM | POA: Diagnosis not present

## 2017-05-21 DIAGNOSIS — M6281 Muscle weakness (generalized): Secondary | ICD-10-CM | POA: Diagnosis not present

## 2017-05-21 DIAGNOSIS — M25512 Pain in left shoulder: Secondary | ICD-10-CM | POA: Diagnosis not present

## 2017-05-21 DIAGNOSIS — F4321 Adjustment disorder with depressed mood: Secondary | ICD-10-CM | POA: Diagnosis not present

## 2017-05-21 DIAGNOSIS — S46812D Strain of other muscles, fascia and tendons at shoulder and upper arm level, left arm, subsequent encounter: Secondary | ICD-10-CM | POA: Diagnosis not present

## 2017-05-25 DIAGNOSIS — S46812D Strain of other muscles, fascia and tendons at shoulder and upper arm level, left arm, subsequent encounter: Secondary | ICD-10-CM | POA: Diagnosis not present

## 2017-05-25 DIAGNOSIS — M6281 Muscle weakness (generalized): Secondary | ICD-10-CM | POA: Diagnosis not present

## 2017-05-25 DIAGNOSIS — M25512 Pain in left shoulder: Secondary | ICD-10-CM | POA: Diagnosis not present

## 2017-05-27 DIAGNOSIS — M25512 Pain in left shoulder: Secondary | ICD-10-CM | POA: Diagnosis not present

## 2017-05-27 DIAGNOSIS — M25511 Pain in right shoulder: Secondary | ICD-10-CM | POA: Diagnosis not present

## 2017-05-27 DIAGNOSIS — M542 Cervicalgia: Secondary | ICD-10-CM | POA: Diagnosis not present

## 2017-05-27 DIAGNOSIS — Z79891 Long term (current) use of opiate analgesic: Secondary | ICD-10-CM | POA: Diagnosis not present

## 2017-05-27 DIAGNOSIS — G894 Chronic pain syndrome: Secondary | ICD-10-CM | POA: Diagnosis not present

## 2017-06-16 DIAGNOSIS — F4321 Adjustment disorder with depressed mood: Secondary | ICD-10-CM | POA: Diagnosis not present

## 2017-06-25 DIAGNOSIS — D492 Neoplasm of unspecified behavior of bone, soft tissue, and skin: Secondary | ICD-10-CM | POA: Diagnosis not present

## 2017-06-26 DIAGNOSIS — M542 Cervicalgia: Secondary | ICD-10-CM | POA: Diagnosis not present

## 2017-06-26 DIAGNOSIS — Z79891 Long term (current) use of opiate analgesic: Secondary | ICD-10-CM | POA: Diagnosis not present

## 2017-06-26 DIAGNOSIS — M25512 Pain in left shoulder: Secondary | ICD-10-CM | POA: Diagnosis not present

## 2017-06-26 DIAGNOSIS — M25511 Pain in right shoulder: Secondary | ICD-10-CM | POA: Diagnosis not present

## 2017-06-26 DIAGNOSIS — G894 Chronic pain syndrome: Secondary | ICD-10-CM | POA: Diagnosis not present

## 2017-07-02 DIAGNOSIS — I160 Hypertensive urgency: Secondary | ICD-10-CM | POA: Diagnosis not present

## 2017-07-02 DIAGNOSIS — R079 Chest pain, unspecified: Secondary | ICD-10-CM | POA: Diagnosis not present

## 2017-07-03 DIAGNOSIS — F4321 Adjustment disorder with depressed mood: Secondary | ICD-10-CM | POA: Diagnosis not present

## 2017-07-07 DIAGNOSIS — J209 Acute bronchitis, unspecified: Secondary | ICD-10-CM | POA: Diagnosis not present

## 2017-07-13 DIAGNOSIS — T7840XA Allergy, unspecified, initial encounter: Secondary | ICD-10-CM | POA: Diagnosis not present

## 2017-07-24 DIAGNOSIS — M25512 Pain in left shoulder: Secondary | ICD-10-CM | POA: Diagnosis not present

## 2017-07-24 DIAGNOSIS — M542 Cervicalgia: Secondary | ICD-10-CM | POA: Diagnosis not present

## 2017-07-24 DIAGNOSIS — M25511 Pain in right shoulder: Secondary | ICD-10-CM | POA: Diagnosis not present

## 2017-07-24 DIAGNOSIS — G894 Chronic pain syndrome: Secondary | ICD-10-CM | POA: Diagnosis not present

## 2017-07-24 DIAGNOSIS — Z79891 Long term (current) use of opiate analgesic: Secondary | ICD-10-CM | POA: Diagnosis not present

## 2017-08-17 DIAGNOSIS — J309 Allergic rhinitis, unspecified: Secondary | ICD-10-CM | POA: Diagnosis not present

## 2017-08-17 DIAGNOSIS — M19011 Primary osteoarthritis, right shoulder: Secondary | ICD-10-CM | POA: Diagnosis not present

## 2017-08-28 DIAGNOSIS — M542 Cervicalgia: Secondary | ICD-10-CM | POA: Diagnosis not present

## 2017-08-28 DIAGNOSIS — G894 Chronic pain syndrome: Secondary | ICD-10-CM | POA: Diagnosis not present

## 2017-08-28 DIAGNOSIS — M25512 Pain in left shoulder: Secondary | ICD-10-CM | POA: Diagnosis not present

## 2017-08-28 DIAGNOSIS — Z79891 Long term (current) use of opiate analgesic: Secondary | ICD-10-CM | POA: Diagnosis not present

## 2017-08-28 DIAGNOSIS — M25511 Pain in right shoulder: Secondary | ICD-10-CM | POA: Diagnosis not present

## 2017-08-31 DIAGNOSIS — S46812A Strain of other muscles, fascia and tendons at shoulder and upper arm level, left arm, initial encounter: Secondary | ICD-10-CM | POA: Diagnosis not present

## 2017-09-14 DIAGNOSIS — Z79899 Other long term (current) drug therapy: Secondary | ICD-10-CM | POA: Diagnosis not present

## 2017-09-14 DIAGNOSIS — R079 Chest pain, unspecified: Secondary | ICD-10-CM | POA: Diagnosis not present

## 2017-09-14 DIAGNOSIS — E039 Hypothyroidism, unspecified: Secondary | ICD-10-CM | POA: Diagnosis not present

## 2017-09-14 DIAGNOSIS — Z79891 Long term (current) use of opiate analgesic: Secondary | ICD-10-CM | POA: Diagnosis not present

## 2017-09-14 DIAGNOSIS — I10 Essential (primary) hypertension: Secondary | ICD-10-CM | POA: Diagnosis not present

## 2017-09-14 DIAGNOSIS — E860 Dehydration: Secondary | ICD-10-CM | POA: Diagnosis not present

## 2017-09-14 DIAGNOSIS — F17210 Nicotine dependence, cigarettes, uncomplicated: Secondary | ICD-10-CM | POA: Diagnosis not present

## 2017-09-14 DIAGNOSIS — G8929 Other chronic pain: Secondary | ICD-10-CM | POA: Diagnosis not present

## 2017-09-14 DIAGNOSIS — R42 Dizziness and giddiness: Secondary | ICD-10-CM | POA: Diagnosis not present

## 2017-09-24 DIAGNOSIS — J069 Acute upper respiratory infection, unspecified: Secondary | ICD-10-CM | POA: Diagnosis not present

## 2017-09-25 DIAGNOSIS — M542 Cervicalgia: Secondary | ICD-10-CM | POA: Diagnosis not present

## 2017-09-25 DIAGNOSIS — M25511 Pain in right shoulder: Secondary | ICD-10-CM | POA: Diagnosis not present

## 2017-09-25 DIAGNOSIS — G894 Chronic pain syndrome: Secondary | ICD-10-CM | POA: Diagnosis not present

## 2017-09-25 DIAGNOSIS — Z79891 Long term (current) use of opiate analgesic: Secondary | ICD-10-CM | POA: Diagnosis not present

## 2017-09-25 DIAGNOSIS — M25512 Pain in left shoulder: Secondary | ICD-10-CM | POA: Diagnosis not present

## 2017-10-02 DIAGNOSIS — R42 Dizziness and giddiness: Secondary | ICD-10-CM | POA: Diagnosis not present

## 2017-10-02 DIAGNOSIS — Z5321 Procedure and treatment not carried out due to patient leaving prior to being seen by health care provider: Secondary | ICD-10-CM | POA: Diagnosis not present

## 2017-10-08 DIAGNOSIS — J4530 Mild persistent asthma, uncomplicated: Secondary | ICD-10-CM | POA: Diagnosis not present

## 2017-10-08 DIAGNOSIS — E669 Obesity, unspecified: Secondary | ICD-10-CM | POA: Diagnosis not present

## 2017-10-08 DIAGNOSIS — I10 Essential (primary) hypertension: Secondary | ICD-10-CM | POA: Diagnosis not present

## 2017-10-08 DIAGNOSIS — Z0000 Encounter for general adult medical examination without abnormal findings: Secondary | ICD-10-CM | POA: Diagnosis not present

## 2017-10-08 DIAGNOSIS — Z125 Encounter for screening for malignant neoplasm of prostate: Secondary | ICD-10-CM | POA: Diagnosis not present

## 2017-10-08 DIAGNOSIS — E291 Testicular hypofunction: Secondary | ICD-10-CM | POA: Diagnosis not present

## 2017-10-08 DIAGNOSIS — F901 Attention-deficit hyperactivity disorder, predominantly hyperactive type: Secondary | ICD-10-CM | POA: Diagnosis not present

## 2017-10-08 DIAGNOSIS — Z79899 Other long term (current) drug therapy: Secondary | ICD-10-CM | POA: Diagnosis not present

## 2017-10-08 DIAGNOSIS — K219 Gastro-esophageal reflux disease without esophagitis: Secondary | ICD-10-CM | POA: Diagnosis not present

## 2017-10-08 DIAGNOSIS — Z6832 Body mass index (BMI) 32.0-32.9, adult: Secondary | ICD-10-CM | POA: Diagnosis not present

## 2017-10-08 DIAGNOSIS — E7800 Pure hypercholesterolemia, unspecified: Secondary | ICD-10-CM | POA: Diagnosis not present

## 2017-10-09 DIAGNOSIS — I10 Essential (primary) hypertension: Secondary | ICD-10-CM | POA: Diagnosis not present

## 2017-10-09 DIAGNOSIS — R05 Cough: Secondary | ICD-10-CM | POA: Diagnosis not present

## 2017-10-09 DIAGNOSIS — R42 Dizziness and giddiness: Secondary | ICD-10-CM | POA: Diagnosis not present

## 2017-10-09 DIAGNOSIS — Z87891 Personal history of nicotine dependence: Secondary | ICD-10-CM | POA: Diagnosis not present

## 2017-10-09 DIAGNOSIS — E7800 Pure hypercholesterolemia, unspecified: Secondary | ICD-10-CM | POA: Diagnosis not present

## 2017-10-23 DIAGNOSIS — M25512 Pain in left shoulder: Secondary | ICD-10-CM | POA: Diagnosis not present

## 2017-10-23 DIAGNOSIS — M542 Cervicalgia: Secondary | ICD-10-CM | POA: Diagnosis not present

## 2017-10-23 DIAGNOSIS — Z79891 Long term (current) use of opiate analgesic: Secondary | ICD-10-CM | POA: Diagnosis not present

## 2017-10-23 DIAGNOSIS — M25511 Pain in right shoulder: Secondary | ICD-10-CM | POA: Diagnosis not present

## 2017-10-23 DIAGNOSIS — G894 Chronic pain syndrome: Secondary | ICD-10-CM | POA: Diagnosis not present

## 2017-10-28 DIAGNOSIS — K910 Vomiting following gastrointestinal surgery: Secondary | ICD-10-CM | POA: Diagnosis not present

## 2017-10-28 DIAGNOSIS — R05 Cough: Secondary | ICD-10-CM | POA: Diagnosis not present

## 2017-11-18 DIAGNOSIS — M19011 Primary osteoarthritis, right shoulder: Secondary | ICD-10-CM | POA: Diagnosis not present

## 2017-11-20 DIAGNOSIS — G894 Chronic pain syndrome: Secondary | ICD-10-CM | POA: Diagnosis not present

## 2017-11-20 DIAGNOSIS — M25511 Pain in right shoulder: Secondary | ICD-10-CM | POA: Diagnosis not present

## 2017-11-20 DIAGNOSIS — Z79891 Long term (current) use of opiate analgesic: Secondary | ICD-10-CM | POA: Diagnosis not present

## 2017-11-20 DIAGNOSIS — M542 Cervicalgia: Secondary | ICD-10-CM | POA: Diagnosis not present

## 2017-11-20 DIAGNOSIS — M25512 Pain in left shoulder: Secondary | ICD-10-CM | POA: Diagnosis not present

## 2017-11-26 DIAGNOSIS — D2239 Melanocytic nevi of other parts of face: Secondary | ICD-10-CM | POA: Diagnosis not present

## 2017-11-26 DIAGNOSIS — D225 Melanocytic nevi of trunk: Secondary | ICD-10-CM | POA: Diagnosis not present

## 2017-11-26 DIAGNOSIS — D485 Neoplasm of uncertain behavior of skin: Secondary | ICD-10-CM | POA: Diagnosis not present

## 2017-11-26 DIAGNOSIS — L739 Follicular disorder, unspecified: Secondary | ICD-10-CM | POA: Diagnosis not present

## 2017-12-15 DIAGNOSIS — C44219 Basal cell carcinoma of skin of left ear and external auricular canal: Secondary | ICD-10-CM | POA: Diagnosis not present

## 2017-12-18 DIAGNOSIS — M25511 Pain in right shoulder: Secondary | ICD-10-CM | POA: Diagnosis not present

## 2017-12-18 DIAGNOSIS — G894 Chronic pain syndrome: Secondary | ICD-10-CM | POA: Diagnosis not present

## 2017-12-18 DIAGNOSIS — M542 Cervicalgia: Secondary | ICD-10-CM | POA: Diagnosis not present

## 2017-12-18 DIAGNOSIS — M25512 Pain in left shoulder: Secondary | ICD-10-CM | POA: Diagnosis not present

## 2017-12-18 DIAGNOSIS — Z79891 Long term (current) use of opiate analgesic: Secondary | ICD-10-CM | POA: Diagnosis not present

## 2017-12-30 DIAGNOSIS — R062 Wheezing: Secondary | ICD-10-CM | POA: Diagnosis not present

## 2017-12-30 DIAGNOSIS — R05 Cough: Secondary | ICD-10-CM | POA: Diagnosis not present

## 2017-12-30 DIAGNOSIS — R42 Dizziness and giddiness: Secondary | ICD-10-CM | POA: Diagnosis not present

## 2017-12-30 DIAGNOSIS — J069 Acute upper respiratory infection, unspecified: Secondary | ICD-10-CM | POA: Diagnosis not present

## 2017-12-30 DIAGNOSIS — R0602 Shortness of breath: Secondary | ICD-10-CM | POA: Diagnosis not present

## 2018-01-09 DIAGNOSIS — R42 Dizziness and giddiness: Secondary | ICD-10-CM | POA: Diagnosis not present

## 2018-01-13 ENCOUNTER — Other Ambulatory Visit: Payer: Self-pay

## 2018-01-13 NOTE — Patient Outreach (Signed)
[facility]) Care Management  01/13/2018  Clarence Larson 1965-11-07 [id number]   Telephone Screen  Referral Date: 01/13/18 Referral Source: [facility] UM Dept. Referral Reason: " member expressed concern with medication co pay for Concerta and a chronic pain medication he has to take" Insurance: [employer]   Outreach attempt # 1 to patient. No answer at present after multiple rings and unable to leave message.      Plan: RN CM will make outreach attempt to patient within 3- business days. RN CM will send unsuccessful outreach letter to patient.   Enzo Montgomery, RN,BSN,CCM [place] Management Telephonic Care Management Coordinator Direct Phone: [phone] Toll Free: [phone] Fax: [phone]

## 2018-01-14 ENCOUNTER — Other Ambulatory Visit: Payer: Self-pay

## 2018-01-14 NOTE — Patient Outreach (Signed)
[facility]) Care Management  01/14/2018  Clarence Larson 04/23/1965 [id number]   Telephone Screen  Referral Date:01/13/18 Referral Source:[facility] UM Dept. Referral Reason:" member expressed concern with medication co pay for Concerta and a chronic pain medication he has to take" Insurance:[employer]A     Incoming call from patient returning RN CM call. RN CM discussed referral reason with patient. Patient voices that he is able to afford all of his meds except Concerta and pain medication. Both of these medications are controlled substances. Advised patient that due to federal regulations and guidelines on controlled substances [facility] can not assist with these medications. Patient voiced understanding. He denies needing any other [facility] services or needs at this time.    Plan: RN CM will close case at this time.  Enzo Montgomery, RN,BSN,CCM [place] Management Telephonic Care Management Coordinator Direct Phone: [phone] Toll Free: [phone] Fax: [phone]

## 2018-01-14 NOTE — Patient Outreach (Signed)
[facility]) Care Management  01/14/2018  Clarence Larson 08/07/1965 [id number]   Telephone Screen  Referral Date: 01/13/18 Referral Source: [facility] UM Dept. Referral Reason: " member expressed concern with medication co pay for Concerta and a chronic pain medication he has to take" Insurance: [employer]TA    Outreach attempt #2 to patient. No answer at present after multiple rings and unable to leave message.     Plan: RN CM will make outreach attempt to patient within 3-4 business days.  Enzo Montgomery, RN,BSN,CCM [place] Management Telephonic Care Management Coordinator Direct Phone: [phone] Toll Free: [phone] Fax: [phone]

## 2018-01-15 DIAGNOSIS — Z79891 Long term (current) use of opiate analgesic: Secondary | ICD-10-CM | POA: Diagnosis not present

## 2018-01-15 DIAGNOSIS — G894 Chronic pain syndrome: Secondary | ICD-10-CM | POA: Diagnosis not present

## 2018-01-15 DIAGNOSIS — M25512 Pain in left shoulder: Secondary | ICD-10-CM | POA: Diagnosis not present

## 2018-01-15 DIAGNOSIS — M542 Cervicalgia: Secondary | ICD-10-CM | POA: Diagnosis not present

## 2018-01-15 DIAGNOSIS — M25511 Pain in right shoulder: Secondary | ICD-10-CM | POA: Diagnosis not present

## 2018-02-02 DIAGNOSIS — Z5329 Procedure and treatment not carried out because of patient's decision for other reasons: Secondary | ICD-10-CM | POA: Diagnosis not present

## 2018-02-02 DIAGNOSIS — R05 Cough: Secondary | ICD-10-CM | POA: Diagnosis not present

## 2018-02-04 DIAGNOSIS — J0100 Acute maxillary sinusitis, unspecified: Secondary | ICD-10-CM | POA: Diagnosis not present

## 2018-02-09 DIAGNOSIS — J4540 Moderate persistent asthma, uncomplicated: Secondary | ICD-10-CM | POA: Diagnosis not present

## 2018-02-09 DIAGNOSIS — R05 Cough: Secondary | ICD-10-CM | POA: Diagnosis not present

## 2018-02-17 DIAGNOSIS — Z79891 Long term (current) use of opiate analgesic: Secondary | ICD-10-CM | POA: Diagnosis not present

## 2018-02-17 DIAGNOSIS — M542 Cervicalgia: Secondary | ICD-10-CM | POA: Diagnosis not present

## 2018-02-17 DIAGNOSIS — M25511 Pain in right shoulder: Secondary | ICD-10-CM | POA: Diagnosis not present

## 2018-02-17 DIAGNOSIS — M25512 Pain in left shoulder: Secondary | ICD-10-CM | POA: Diagnosis not present

## 2018-02-17 DIAGNOSIS — G894 Chronic pain syndrome: Secondary | ICD-10-CM | POA: Diagnosis not present

## 2018-03-05 DIAGNOSIS — R52 Pain, unspecified: Secondary | ICD-10-CM | POA: Diagnosis not present

## 2018-03-05 DIAGNOSIS — E871 Hypo-osmolality and hyponatremia: Secondary | ICD-10-CM | POA: Diagnosis not present

## 2018-03-05 DIAGNOSIS — R55 Syncope and collapse: Secondary | ICD-10-CM | POA: Diagnosis not present

## 2018-03-05 DIAGNOSIS — R42 Dizziness and giddiness: Secondary | ICD-10-CM | POA: Diagnosis not present

## 2018-03-05 DIAGNOSIS — R531 Weakness: Secondary | ICD-10-CM | POA: Diagnosis not present

## 2018-03-08 DIAGNOSIS — R5381 Other malaise: Secondary | ICD-10-CM | POA: Diagnosis not present

## 2018-03-08 DIAGNOSIS — E871 Hypo-osmolality and hyponatremia: Secondary | ICD-10-CM | POA: Diagnosis not present

## 2018-03-08 DIAGNOSIS — Z79899 Other long term (current) drug therapy: Secondary | ICD-10-CM | POA: Diagnosis not present

## 2018-03-12 DIAGNOSIS — J4540 Moderate persistent asthma, uncomplicated: Secondary | ICD-10-CM | POA: Diagnosis not present

## 2018-03-17 DIAGNOSIS — Z79891 Long term (current) use of opiate analgesic: Secondary | ICD-10-CM | POA: Diagnosis not present

## 2018-03-17 DIAGNOSIS — M25511 Pain in right shoulder: Secondary | ICD-10-CM | POA: Diagnosis not present

## 2018-03-17 DIAGNOSIS — G894 Chronic pain syndrome: Secondary | ICD-10-CM | POA: Diagnosis not present

## 2018-03-17 DIAGNOSIS — M542 Cervicalgia: Secondary | ICD-10-CM | POA: Diagnosis not present

## 2018-03-17 DIAGNOSIS — M25512 Pain in left shoulder: Secondary | ICD-10-CM | POA: Diagnosis not present

## 2018-03-23 DIAGNOSIS — Z7982 Long term (current) use of aspirin: Secondary | ICD-10-CM | POA: Diagnosis not present

## 2018-03-23 DIAGNOSIS — G8929 Other chronic pain: Secondary | ICD-10-CM | POA: Diagnosis not present

## 2018-03-23 DIAGNOSIS — Z79899 Other long term (current) drug therapy: Secondary | ICD-10-CM | POA: Diagnosis not present

## 2018-03-23 DIAGNOSIS — I10 Essential (primary) hypertension: Secondary | ICD-10-CM | POA: Diagnosis not present

## 2018-03-23 DIAGNOSIS — E039 Hypothyroidism, unspecified: Secondary | ICD-10-CM | POA: Diagnosis not present

## 2018-03-23 DIAGNOSIS — R51 Headache: Secondary | ICD-10-CM | POA: Diagnosis not present

## 2018-03-23 DIAGNOSIS — R42 Dizziness and giddiness: Secondary | ICD-10-CM | POA: Diagnosis not present

## 2018-03-24 DIAGNOSIS — J309 Allergic rhinitis, unspecified: Secondary | ICD-10-CM | POA: Diagnosis not present

## 2018-03-25 DIAGNOSIS — J3489 Other specified disorders of nose and nasal sinuses: Secondary | ICD-10-CM | POA: Diagnosis not present

## 2018-03-25 DIAGNOSIS — H6122 Impacted cerumen, left ear: Secondary | ICD-10-CM | POA: Diagnosis not present

## 2018-03-25 DIAGNOSIS — J00 Acute nasopharyngitis [common cold]: Secondary | ICD-10-CM | POA: Diagnosis not present

## 2018-03-25 DIAGNOSIS — J019 Acute sinusitis, unspecified: Secondary | ICD-10-CM | POA: Diagnosis not present

## 2018-03-25 DIAGNOSIS — J310 Chronic rhinitis: Secondary | ICD-10-CM | POA: Diagnosis not present

## 2018-03-25 DIAGNOSIS — J342 Deviated nasal septum: Secondary | ICD-10-CM | POA: Diagnosis not present

## 2018-03-30 DIAGNOSIS — Z5321 Procedure and treatment not carried out due to patient leaving prior to being seen by health care provider: Secondary | ICD-10-CM | POA: Diagnosis not present

## 2018-03-31 DIAGNOSIS — Z982 Presence of cerebrospinal fluid drainage device: Secondary | ICD-10-CM | POA: Diagnosis not present

## 2018-03-31 DIAGNOSIS — R42 Dizziness and giddiness: Secondary | ICD-10-CM | POA: Diagnosis not present

## 2018-03-31 DIAGNOSIS — Z7982 Long term (current) use of aspirin: Secondary | ICD-10-CM | POA: Diagnosis not present

## 2018-03-31 DIAGNOSIS — Z5321 Procedure and treatment not carried out due to patient leaving prior to being seen by health care provider: Secondary | ICD-10-CM | POA: Diagnosis not present

## 2018-03-31 DIAGNOSIS — G894 Chronic pain syndrome: Secondary | ICD-10-CM | POA: Diagnosis not present

## 2018-03-31 DIAGNOSIS — B9689 Other specified bacterial agents as the cause of diseases classified elsewhere: Secondary | ICD-10-CM | POA: Diagnosis not present

## 2018-03-31 DIAGNOSIS — Z9114 Patient's other noncompliance with medication regimen: Secondary | ICD-10-CM | POA: Diagnosis not present

## 2018-03-31 DIAGNOSIS — N179 Acute kidney failure, unspecified: Secondary | ICD-10-CM | POA: Diagnosis not present

## 2018-03-31 DIAGNOSIS — F1123 Opioid dependence with withdrawal: Secondary | ICD-10-CM | POA: Diagnosis not present

## 2018-03-31 DIAGNOSIS — R0602 Shortness of breath: Secondary | ICD-10-CM | POA: Diagnosis not present

## 2018-03-31 DIAGNOSIS — D649 Anemia, unspecified: Secondary | ICD-10-CM | POA: Diagnosis not present

## 2018-03-31 DIAGNOSIS — K219 Gastro-esophageal reflux disease without esophagitis: Secondary | ICD-10-CM | POA: Diagnosis not present

## 2018-03-31 DIAGNOSIS — E7800 Pure hypercholesterolemia, unspecified: Secondary | ICD-10-CM | POA: Diagnosis not present

## 2018-03-31 DIAGNOSIS — K429 Umbilical hernia without obstruction or gangrene: Secondary | ICD-10-CM | POA: Diagnosis not present

## 2018-03-31 DIAGNOSIS — M1990 Unspecified osteoarthritis, unspecified site: Secondary | ICD-10-CM | POA: Diagnosis not present

## 2018-03-31 DIAGNOSIS — R0609 Other forms of dyspnea: Secondary | ICD-10-CM | POA: Diagnosis not present

## 2018-03-31 DIAGNOSIS — Z79899 Other long term (current) drug therapy: Secondary | ICD-10-CM | POA: Diagnosis not present

## 2018-03-31 DIAGNOSIS — R079 Chest pain, unspecified: Secondary | ICD-10-CM | POA: Diagnosis not present

## 2018-03-31 DIAGNOSIS — Z885 Allergy status to narcotic agent status: Secondary | ICD-10-CM | POA: Diagnosis not present

## 2018-03-31 DIAGNOSIS — J069 Acute upper respiratory infection, unspecified: Secondary | ICD-10-CM | POA: Diagnosis not present

## 2018-03-31 DIAGNOSIS — I161 Hypertensive emergency: Secondary | ICD-10-CM | POA: Diagnosis not present

## 2018-03-31 DIAGNOSIS — I10 Essential (primary) hypertension: Secondary | ICD-10-CM | POA: Diagnosis not present

## 2018-03-31 DIAGNOSIS — I160 Hypertensive urgency: Secondary | ICD-10-CM | POA: Diagnosis not present

## 2018-03-31 DIAGNOSIS — E039 Hypothyroidism, unspecified: Secondary | ICD-10-CM | POA: Diagnosis not present

## 2018-04-05 ENCOUNTER — Other Ambulatory Visit: Payer: Self-pay

## 2018-04-05 NOTE — Patient Outreach (Signed)
[name]) Care Management  04/05/2018  Clarence Larson 06-15-1965 [id number]   Referral Date: 04/05/2018 Referral Source:  [facility] report Date of Admission: unknown Diagnosis:  HTN Date of Discharge: 04/05/2018 Facility:  [place]:  [facility]A  Outreach attempt: no answer.  Unable to leave a message.    Plan: RN CM will attempt patient again within 4 business days and send letter.     Jone Baseman, RN, MSN [facility] Care Management Care Management Coordinator Direct Line [phone] Toll Free: [phone]  Fax: [phone]

## 2018-04-06 ENCOUNTER — Other Ambulatory Visit: Payer: Self-pay

## 2018-04-06 NOTE — Patient Outreach (Signed)
[facility]) Care Management  04/06/2018  JOBANNY MAVIS 03/31/1965 [id number]   Referral Date: 04/05/2018 Referral Source:  [facility] report Date of Admission: unknown Diagnosis:  HTN Date of Discharge: 04/05/2018 Facility:  [place]:  HTA  Outreach attempt: no answer.  Unable to leave a message.    Plan: RN CM will attempt patient again within 4 business days.  Jone Baseman, RN, MSN [place] Management Care Management Coordinator Direct Line [phone] Cell [phone] Toll Free: [phone]  Fax: [phone]

## 2018-04-07 ENCOUNTER — Other Ambulatory Visit: Payer: Self-pay

## 2018-04-07 NOTE — Patient Outreach (Signed)
[facility]) Care Management  04/07/2018  JAVON SNEE January 24, 1966 [id number]   Referral Date:04/05/2018 Referral Source:[facility] report Date of Admission:unknown Diagnosis:HTN Date of Discharge:04/05/2018 Facility:[facility] Insurance:HTA  Outreach attempt:no answer. Unable to leave a message.   Plan: RN CM willwait return call.  If no return call will close case.  Jone Baseman, RN, MSN [place] Management Care Management Coordinator Direct Line [phone] Cell [phone] Toll Free: [phone]  Fax: [phone]

## 2018-04-09 DIAGNOSIS — Z8709 Personal history of other diseases of the respiratory system: Secondary | ICD-10-CM | POA: Diagnosis not present

## 2018-04-09 DIAGNOSIS — J310 Chronic rhinitis: Secondary | ICD-10-CM | POA: Diagnosis not present

## 2018-04-09 DIAGNOSIS — J342 Deviated nasal septum: Secondary | ICD-10-CM | POA: Diagnosis not present

## 2018-04-10 DIAGNOSIS — J4540 Moderate persistent asthma, uncomplicated: Secondary | ICD-10-CM | POA: Diagnosis not present

## 2018-04-13 DIAGNOSIS — I10 Essential (primary) hypertension: Secondary | ICD-10-CM | POA: Diagnosis not present

## 2018-04-14 DIAGNOSIS — Z79891 Long term (current) use of opiate analgesic: Secondary | ICD-10-CM | POA: Diagnosis not present

## 2018-04-14 DIAGNOSIS — M25511 Pain in right shoulder: Secondary | ICD-10-CM | POA: Diagnosis not present

## 2018-04-14 DIAGNOSIS — M25512 Pain in left shoulder: Secondary | ICD-10-CM | POA: Diagnosis not present

## 2018-04-14 DIAGNOSIS — M542 Cervicalgia: Secondary | ICD-10-CM | POA: Diagnosis not present

## 2018-04-14 DIAGNOSIS — G894 Chronic pain syndrome: Secondary | ICD-10-CM | POA: Diagnosis not present

## 2018-04-20 ENCOUNTER — Other Ambulatory Visit: Payer: Self-pay

## 2018-04-20 NOTE — Patient Outreach (Signed)
[facility]) Care Management  04/20/2018  Clarence Larson July 17, 1965 [id number]   Multiple attempts to establish contact with patient without success. No response from letter mailed to patient.   Plan: RN CM will close case at this time.  Jone Baseman, RN, MSN [place] Management Care Management Coordinator Direct Line [phone] Cell [phone] Toll Free: [phone]  Fax: [phone]

## 2018-05-10 DIAGNOSIS — G894 Chronic pain syndrome: Secondary | ICD-10-CM | POA: Diagnosis not present

## 2018-05-10 DIAGNOSIS — M25512 Pain in left shoulder: Secondary | ICD-10-CM | POA: Diagnosis not present

## 2018-05-10 DIAGNOSIS — M542 Cervicalgia: Secondary | ICD-10-CM | POA: Diagnosis not present

## 2018-05-10 DIAGNOSIS — Z79891 Long term (current) use of opiate analgesic: Secondary | ICD-10-CM | POA: Diagnosis not present

## 2018-05-10 DIAGNOSIS — M25511 Pain in right shoulder: Secondary | ICD-10-CM | POA: Diagnosis not present

## 2018-05-11 DIAGNOSIS — J4540 Moderate persistent asthma, uncomplicated: Secondary | ICD-10-CM | POA: Diagnosis not present

## 2018-05-20 DIAGNOSIS — J069 Acute upper respiratory infection, unspecified: Secondary | ICD-10-CM | POA: Diagnosis not present

## 2018-05-20 DIAGNOSIS — I6523 Occlusion and stenosis of bilateral carotid arteries: Secondary | ICD-10-CM | POA: Diagnosis not present

## 2018-05-20 DIAGNOSIS — R05 Cough: Secondary | ICD-10-CM | POA: Diagnosis not present

## 2018-05-20 DIAGNOSIS — B9689 Other specified bacterial agents as the cause of diseases classified elsewhere: Secondary | ICD-10-CM | POA: Diagnosis not present

## 2018-06-01 DIAGNOSIS — M19011 Primary osteoarthritis, right shoulder: Secondary | ICD-10-CM | POA: Diagnosis not present

## 2018-06-01 DIAGNOSIS — S46812A Strain of other muscles, fascia and tendons at shoulder and upper arm level, left arm, initial encounter: Secondary | ICD-10-CM | POA: Diagnosis not present

## 2018-06-07 DIAGNOSIS — M25512 Pain in left shoulder: Secondary | ICD-10-CM | POA: Diagnosis not present

## 2018-06-07 DIAGNOSIS — Z79891 Long term (current) use of opiate analgesic: Secondary | ICD-10-CM | POA: Diagnosis not present

## 2018-06-07 DIAGNOSIS — G894 Chronic pain syndrome: Secondary | ICD-10-CM | POA: Diagnosis not present

## 2018-06-07 DIAGNOSIS — M542 Cervicalgia: Secondary | ICD-10-CM | POA: Diagnosis not present

## 2018-06-07 DIAGNOSIS — M25511 Pain in right shoulder: Secondary | ICD-10-CM | POA: Diagnosis not present

## 2018-06-09 DIAGNOSIS — I10 Essential (primary) hypertension: Secondary | ICD-10-CM | POA: Diagnosis not present

## 2018-06-09 DIAGNOSIS — J309 Allergic rhinitis, unspecified: Secondary | ICD-10-CM | POA: Diagnosis not present

## 2018-06-09 DIAGNOSIS — E669 Obesity, unspecified: Secondary | ICD-10-CM | POA: Diagnosis not present

## 2018-06-09 DIAGNOSIS — Z6831 Body mass index (BMI) 31.0-31.9, adult: Secondary | ICD-10-CM | POA: Diagnosis not present

## 2018-06-09 DIAGNOSIS — E7800 Pure hypercholesterolemia, unspecified: Secondary | ICD-10-CM | POA: Diagnosis not present

## 2018-06-09 DIAGNOSIS — Z79899 Other long term (current) drug therapy: Secondary | ICD-10-CM | POA: Diagnosis not present

## 2018-06-10 DIAGNOSIS — J4540 Moderate persistent asthma, uncomplicated: Secondary | ICD-10-CM | POA: Diagnosis not present

## 2018-06-24 DIAGNOSIS — Z6834 Body mass index (BMI) 34.0-34.9, adult: Secondary | ICD-10-CM | POA: Diagnosis not present

## 2018-06-24 DIAGNOSIS — E669 Obesity, unspecified: Secondary | ICD-10-CM | POA: Diagnosis not present

## 2018-06-24 DIAGNOSIS — Z79899 Other long term (current) drug therapy: Secondary | ICD-10-CM | POA: Diagnosis not present

## 2018-06-24 DIAGNOSIS — I10 Essential (primary) hypertension: Secondary | ICD-10-CM | POA: Diagnosis not present

## 2018-06-24 DIAGNOSIS — J309 Allergic rhinitis, unspecified: Secondary | ICD-10-CM | POA: Diagnosis not present

## 2018-06-24 DIAGNOSIS — E7800 Pure hypercholesterolemia, unspecified: Secondary | ICD-10-CM | POA: Diagnosis not present

## 2018-06-24 DIAGNOSIS — Z6831 Body mass index (BMI) 31.0-31.9, adult: Secondary | ICD-10-CM | POA: Diagnosis not present

## 2018-07-07 DIAGNOSIS — M25512 Pain in left shoulder: Secondary | ICD-10-CM | POA: Diagnosis not present

## 2018-07-07 DIAGNOSIS — M542 Cervicalgia: Secondary | ICD-10-CM | POA: Diagnosis not present

## 2018-07-07 DIAGNOSIS — Z79891 Long term (current) use of opiate analgesic: Secondary | ICD-10-CM | POA: Diagnosis not present

## 2018-07-07 DIAGNOSIS — M25511 Pain in right shoulder: Secondary | ICD-10-CM | POA: Diagnosis not present

## 2018-07-07 DIAGNOSIS — G894 Chronic pain syndrome: Secondary | ICD-10-CM | POA: Diagnosis not present

## 2018-07-11 DIAGNOSIS — J4540 Moderate persistent asthma, uncomplicated: Secondary | ICD-10-CM | POA: Diagnosis not present

## 2018-08-02 DIAGNOSIS — M542 Cervicalgia: Secondary | ICD-10-CM | POA: Diagnosis not present

## 2018-08-02 DIAGNOSIS — G894 Chronic pain syndrome: Secondary | ICD-10-CM | POA: Diagnosis not present

## 2018-08-02 DIAGNOSIS — M25511 Pain in right shoulder: Secondary | ICD-10-CM | POA: Diagnosis not present

## 2018-08-02 DIAGNOSIS — M25512 Pain in left shoulder: Secondary | ICD-10-CM | POA: Diagnosis not present

## 2018-08-02 DIAGNOSIS — Z79891 Long term (current) use of opiate analgesic: Secondary | ICD-10-CM | POA: Diagnosis not present

## 2018-08-10 DIAGNOSIS — J4540 Moderate persistent asthma, uncomplicated: Secondary | ICD-10-CM | POA: Diagnosis not present

## 2018-08-30 DIAGNOSIS — M542 Cervicalgia: Secondary | ICD-10-CM | POA: Diagnosis not present

## 2018-08-30 DIAGNOSIS — M25512 Pain in left shoulder: Secondary | ICD-10-CM | POA: Diagnosis not present

## 2018-08-30 DIAGNOSIS — M25511 Pain in right shoulder: Secondary | ICD-10-CM | POA: Diagnosis not present

## 2018-08-30 DIAGNOSIS — Z79891 Long term (current) use of opiate analgesic: Secondary | ICD-10-CM | POA: Diagnosis not present

## 2018-08-30 DIAGNOSIS — G894 Chronic pain syndrome: Secondary | ICD-10-CM | POA: Diagnosis not present

## 2018-09-10 DIAGNOSIS — Z6833 Body mass index (BMI) 33.0-33.9, adult: Secondary | ICD-10-CM | POA: Diagnosis not present

## 2018-09-10 DIAGNOSIS — E669 Obesity, unspecified: Secondary | ICD-10-CM | POA: Diagnosis not present

## 2018-09-10 DIAGNOSIS — E7800 Pure hypercholesterolemia, unspecified: Secondary | ICD-10-CM | POA: Diagnosis not present

## 2018-09-10 DIAGNOSIS — F901 Attention-deficit hyperactivity disorder, predominantly hyperactive type: Secondary | ICD-10-CM | POA: Diagnosis not present

## 2018-09-10 DIAGNOSIS — J4540 Moderate persistent asthma, uncomplicated: Secondary | ICD-10-CM | POA: Diagnosis not present

## 2018-09-10 DIAGNOSIS — E291 Testicular hypofunction: Secondary | ICD-10-CM | POA: Diagnosis not present

## 2018-09-10 DIAGNOSIS — K219 Gastro-esophageal reflux disease without esophagitis: Secondary | ICD-10-CM | POA: Diagnosis not present

## 2018-09-10 DIAGNOSIS — I10 Essential (primary) hypertension: Secondary | ICD-10-CM | POA: Diagnosis not present

## 2018-09-16 DIAGNOSIS — M19011 Primary osteoarthritis, right shoulder: Secondary | ICD-10-CM | POA: Diagnosis not present

## 2018-09-18 DIAGNOSIS — R462 Strange and inexplicable behavior: Secondary | ICD-10-CM | POA: Diagnosis not present

## 2018-09-18 DIAGNOSIS — R531 Weakness: Secondary | ICD-10-CM | POA: Diagnosis not present

## 2018-09-18 DIAGNOSIS — G259 Extrapyramidal and movement disorder, unspecified: Secondary | ICD-10-CM | POA: Diagnosis not present

## 2018-09-27 DIAGNOSIS — M25512 Pain in left shoulder: Secondary | ICD-10-CM | POA: Diagnosis not present

## 2018-09-27 DIAGNOSIS — Z79891 Long term (current) use of opiate analgesic: Secondary | ICD-10-CM | POA: Diagnosis not present

## 2018-09-27 DIAGNOSIS — G894 Chronic pain syndrome: Secondary | ICD-10-CM | POA: Diagnosis not present

## 2018-09-27 DIAGNOSIS — M25511 Pain in right shoulder: Secondary | ICD-10-CM | POA: Diagnosis not present

## 2018-09-27 DIAGNOSIS — M542 Cervicalgia: Secondary | ICD-10-CM | POA: Diagnosis not present

## 2018-10-11 DIAGNOSIS — J4540 Moderate persistent asthma, uncomplicated: Secondary | ICD-10-CM | POA: Diagnosis not present

## 2018-10-19 DIAGNOSIS — M7021 Olecranon bursitis, right elbow: Secondary | ICD-10-CM | POA: Diagnosis not present

## 2018-10-24 DIAGNOSIS — M71121 Other infective bursitis, right elbow: Secondary | ICD-10-CM | POA: Diagnosis not present

## 2018-10-24 DIAGNOSIS — M25521 Pain in right elbow: Secondary | ICD-10-CM | POA: Diagnosis not present

## 2018-10-24 DIAGNOSIS — M7989 Other specified soft tissue disorders: Secondary | ICD-10-CM | POA: Diagnosis not present

## 2018-10-24 DIAGNOSIS — L03113 Cellulitis of right upper limb: Secondary | ICD-10-CM | POA: Diagnosis not present

## 2018-10-24 DIAGNOSIS — R600 Localized edema: Secondary | ICD-10-CM | POA: Diagnosis not present

## 2018-10-25 DIAGNOSIS — M7021 Olecranon bursitis, right elbow: Secondary | ICD-10-CM | POA: Diagnosis not present

## 2018-10-27 DIAGNOSIS — Z79891 Long term (current) use of opiate analgesic: Secondary | ICD-10-CM | POA: Diagnosis not present

## 2018-10-27 DIAGNOSIS — M25511 Pain in right shoulder: Secondary | ICD-10-CM | POA: Diagnosis not present

## 2018-10-27 DIAGNOSIS — M542 Cervicalgia: Secondary | ICD-10-CM | POA: Diagnosis not present

## 2018-10-27 DIAGNOSIS — G894 Chronic pain syndrome: Secondary | ICD-10-CM | POA: Diagnosis not present

## 2018-10-27 DIAGNOSIS — M25512 Pain in left shoulder: Secondary | ICD-10-CM | POA: Diagnosis not present

## 2018-11-10 DIAGNOSIS — J4540 Moderate persistent asthma, uncomplicated: Secondary | ICD-10-CM | POA: Diagnosis not present

## 2018-11-12 DIAGNOSIS — Z125 Encounter for screening for malignant neoplasm of prostate: Secondary | ICD-10-CM | POA: Diagnosis not present

## 2018-11-12 DIAGNOSIS — J309 Allergic rhinitis, unspecified: Secondary | ICD-10-CM | POA: Diagnosis not present

## 2018-11-12 DIAGNOSIS — E7800 Pure hypercholesterolemia, unspecified: Secondary | ICD-10-CM | POA: Diagnosis not present

## 2018-11-12 DIAGNOSIS — E291 Testicular hypofunction: Secondary | ICD-10-CM | POA: Diagnosis not present

## 2018-11-12 DIAGNOSIS — Z6833 Body mass index (BMI) 33.0-33.9, adult: Secondary | ICD-10-CM | POA: Diagnosis not present

## 2018-11-12 DIAGNOSIS — F901 Attention-deficit hyperactivity disorder, predominantly hyperactive type: Secondary | ICD-10-CM | POA: Diagnosis not present

## 2018-11-12 DIAGNOSIS — I10 Essential (primary) hypertension: Secondary | ICD-10-CM | POA: Diagnosis not present

## 2018-11-12 DIAGNOSIS — Z23 Encounter for immunization: Secondary | ICD-10-CM | POA: Diagnosis not present

## 2018-11-12 DIAGNOSIS — Z0000 Encounter for general adult medical examination without abnormal findings: Secondary | ICD-10-CM | POA: Diagnosis not present

## 2018-11-12 DIAGNOSIS — E669 Obesity, unspecified: Secondary | ICD-10-CM | POA: Diagnosis not present

## 2018-11-22 DIAGNOSIS — M25511 Pain in right shoulder: Secondary | ICD-10-CM | POA: Diagnosis not present

## 2018-11-22 DIAGNOSIS — M25512 Pain in left shoulder: Secondary | ICD-10-CM | POA: Diagnosis not present

## 2018-11-22 DIAGNOSIS — M542 Cervicalgia: Secondary | ICD-10-CM | POA: Diagnosis not present

## 2018-11-22 DIAGNOSIS — G894 Chronic pain syndrome: Secondary | ICD-10-CM | POA: Diagnosis not present

## 2018-11-22 DIAGNOSIS — Z79891 Long term (current) use of opiate analgesic: Secondary | ICD-10-CM | POA: Diagnosis not present

## 2018-11-24 DIAGNOSIS — E7800 Pure hypercholesterolemia, unspecified: Secondary | ICD-10-CM | POA: Diagnosis not present

## 2018-11-27 DIAGNOSIS — G8929 Other chronic pain: Secondary | ICD-10-CM | POA: Diagnosis not present

## 2018-11-27 DIAGNOSIS — K5730 Diverticulosis of large intestine without perforation or abscess without bleeding: Secondary | ICD-10-CM | POA: Diagnosis not present

## 2018-11-27 DIAGNOSIS — R079 Chest pain, unspecified: Secondary | ICD-10-CM | POA: Diagnosis not present

## 2018-11-27 DIAGNOSIS — R0602 Shortness of breath: Secondary | ICD-10-CM | POA: Diagnosis not present

## 2018-11-27 DIAGNOSIS — R5381 Other malaise: Secondary | ICD-10-CM | POA: Diagnosis not present

## 2018-11-27 DIAGNOSIS — E7800 Pure hypercholesterolemia, unspecified: Secondary | ICD-10-CM | POA: Diagnosis not present

## 2018-11-27 DIAGNOSIS — R0981 Nasal congestion: Secondary | ICD-10-CM | POA: Diagnosis not present

## 2018-11-27 DIAGNOSIS — M1990 Unspecified osteoarthritis, unspecified site: Secondary | ICD-10-CM | POA: Diagnosis not present

## 2018-11-27 DIAGNOSIS — R05 Cough: Secondary | ICD-10-CM | POA: Diagnosis not present

## 2018-11-27 DIAGNOSIS — I10 Essential (primary) hypertension: Secondary | ICD-10-CM | POA: Diagnosis not present

## 2018-11-27 DIAGNOSIS — R5383 Other fatigue: Secondary | ICD-10-CM | POA: Diagnosis not present

## 2018-11-27 DIAGNOSIS — R42 Dizziness and giddiness: Secondary | ICD-10-CM | POA: Diagnosis not present

## 2018-11-27 DIAGNOSIS — Z20828 Contact with and (suspected) exposure to other viral communicable diseases: Secondary | ICD-10-CM | POA: Diagnosis not present

## 2018-12-01 ENCOUNTER — Encounter: Payer: Self-pay | Admitting: Gastroenterology

## 2018-12-10 DIAGNOSIS — I10 Essential (primary) hypertension: Secondary | ICD-10-CM | POA: Diagnosis not present

## 2018-12-11 DIAGNOSIS — J4540 Moderate persistent asthma, uncomplicated: Secondary | ICD-10-CM | POA: Diagnosis not present

## 2018-12-20 DIAGNOSIS — M25511 Pain in right shoulder: Secondary | ICD-10-CM | POA: Diagnosis not present

## 2018-12-20 DIAGNOSIS — M542 Cervicalgia: Secondary | ICD-10-CM | POA: Diagnosis not present

## 2018-12-20 DIAGNOSIS — Z79891 Long term (current) use of opiate analgesic: Secondary | ICD-10-CM | POA: Diagnosis not present

## 2018-12-20 DIAGNOSIS — M25512 Pain in left shoulder: Secondary | ICD-10-CM | POA: Diagnosis not present

## 2018-12-20 DIAGNOSIS — G894 Chronic pain syndrome: Secondary | ICD-10-CM | POA: Diagnosis not present

## 2019-01-10 DIAGNOSIS — Z20828 Contact with and (suspected) exposure to other viral communicable diseases: Secondary | ICD-10-CM | POA: Diagnosis not present

## 2019-01-10 DIAGNOSIS — J4540 Moderate persistent asthma, uncomplicated: Secondary | ICD-10-CM | POA: Diagnosis not present

## 2019-01-10 DIAGNOSIS — Z03818 Encounter for observation for suspected exposure to other biological agents ruled out: Secondary | ICD-10-CM | POA: Diagnosis not present

## 2019-01-17 DIAGNOSIS — Z79891 Long term (current) use of opiate analgesic: Secondary | ICD-10-CM | POA: Diagnosis not present

## 2019-01-17 DIAGNOSIS — M25512 Pain in left shoulder: Secondary | ICD-10-CM | POA: Diagnosis not present

## 2019-01-17 DIAGNOSIS — M25511 Pain in right shoulder: Secondary | ICD-10-CM | POA: Diagnosis not present

## 2019-01-17 DIAGNOSIS — M542 Cervicalgia: Secondary | ICD-10-CM | POA: Diagnosis not present

## 2019-01-17 DIAGNOSIS — G894 Chronic pain syndrome: Secondary | ICD-10-CM | POA: Diagnosis not present

## 2019-01-19 DIAGNOSIS — M19011 Primary osteoarthritis, right shoulder: Secondary | ICD-10-CM | POA: Diagnosis not present

## 2019-02-06 DIAGNOSIS — R0602 Shortness of breath: Secondary | ICD-10-CM | POA: Diagnosis not present

## 2019-02-06 DIAGNOSIS — R0981 Nasal congestion: Secondary | ICD-10-CM | POA: Diagnosis not present

## 2019-02-06 DIAGNOSIS — Z20828 Contact with and (suspected) exposure to other viral communicable diseases: Secondary | ICD-10-CM | POA: Diagnosis not present

## 2019-02-06 DIAGNOSIS — R05 Cough: Secondary | ICD-10-CM | POA: Diagnosis not present

## 2019-02-08 DIAGNOSIS — Z79891 Long term (current) use of opiate analgesic: Secondary | ICD-10-CM | POA: Diagnosis not present

## 2019-02-08 DIAGNOSIS — G8929 Other chronic pain: Secondary | ICD-10-CM | POA: Diagnosis not present

## 2019-02-08 DIAGNOSIS — M25512 Pain in left shoulder: Secondary | ICD-10-CM | POA: Diagnosis not present

## 2019-02-08 DIAGNOSIS — G894 Chronic pain syndrome: Secondary | ICD-10-CM | POA: Diagnosis not present

## 2019-02-08 DIAGNOSIS — M25511 Pain in right shoulder: Secondary | ICD-10-CM | POA: Diagnosis not present

## 2019-02-08 DIAGNOSIS — M542 Cervicalgia: Secondary | ICD-10-CM | POA: Diagnosis not present

## 2019-02-10 DIAGNOSIS — J4540 Moderate persistent asthma, uncomplicated: Secondary | ICD-10-CM | POA: Diagnosis not present

## 2019-02-17 DIAGNOSIS — Z6832 Body mass index (BMI) 32.0-32.9, adult: Secondary | ICD-10-CM | POA: Diagnosis not present

## 2019-02-17 DIAGNOSIS — K219 Gastro-esophageal reflux disease without esophagitis: Secondary | ICD-10-CM | POA: Diagnosis not present

## 2019-02-17 DIAGNOSIS — J4530 Mild persistent asthma, uncomplicated: Secondary | ICD-10-CM | POA: Diagnosis not present

## 2019-02-17 DIAGNOSIS — E7800 Pure hypercholesterolemia, unspecified: Secondary | ICD-10-CM | POA: Diagnosis not present

## 2019-02-17 DIAGNOSIS — I10 Essential (primary) hypertension: Secondary | ICD-10-CM | POA: Diagnosis not present

## 2019-02-17 DIAGNOSIS — E669 Obesity, unspecified: Secondary | ICD-10-CM | POA: Diagnosis not present

## 2019-02-17 DIAGNOSIS — J309 Allergic rhinitis, unspecified: Secondary | ICD-10-CM | POA: Diagnosis not present

## 2019-02-19 DIAGNOSIS — M545 Low back pain: Secondary | ICD-10-CM | POA: Diagnosis not present

## 2019-02-19 DIAGNOSIS — G8929 Other chronic pain: Secondary | ICD-10-CM | POA: Diagnosis not present

## 2019-02-19 DIAGNOSIS — Z79899 Other long term (current) drug therapy: Secondary | ICD-10-CM | POA: Diagnosis not present

## 2019-02-19 DIAGNOSIS — M542 Cervicalgia: Secondary | ICD-10-CM | POA: Diagnosis not present

## 2019-02-19 DIAGNOSIS — M25512 Pain in left shoulder: Secondary | ICD-10-CM | POA: Diagnosis not present

## 2019-03-05 DIAGNOSIS — R0600 Dyspnea, unspecified: Secondary | ICD-10-CM | POA: Diagnosis not present

## 2019-03-05 DIAGNOSIS — Z20822 Contact with and (suspected) exposure to covid-19: Secondary | ICD-10-CM | POA: Diagnosis not present

## 2019-03-05 DIAGNOSIS — J209 Acute bronchitis, unspecified: Secondary | ICD-10-CM | POA: Diagnosis not present

## 2019-03-05 DIAGNOSIS — R05 Cough: Secondary | ICD-10-CM | POA: Diagnosis not present

## 2019-03-09 DIAGNOSIS — R05 Cough: Secondary | ICD-10-CM | POA: Diagnosis not present

## 2019-03-13 DIAGNOSIS — J4540 Moderate persistent asthma, uncomplicated: Secondary | ICD-10-CM | POA: Diagnosis not present

## 2019-03-24 DIAGNOSIS — J069 Acute upper respiratory infection, unspecified: Secondary | ICD-10-CM | POA: Diagnosis not present

## 2019-03-24 DIAGNOSIS — R5381 Other malaise: Secondary | ICD-10-CM | POA: Diagnosis not present

## 2019-03-24 DIAGNOSIS — Z20828 Contact with and (suspected) exposure to other viral communicable diseases: Secondary | ICD-10-CM | POA: Diagnosis not present

## 2019-03-29 DIAGNOSIS — M961 Postlaminectomy syndrome, not elsewhere classified: Secondary | ICD-10-CM | POA: Diagnosis not present

## 2019-04-10 DIAGNOSIS — J4540 Moderate persistent asthma, uncomplicated: Secondary | ICD-10-CM | POA: Diagnosis not present

## 2019-04-20 DIAGNOSIS — Z79899 Other long term (current) drug therapy: Secondary | ICD-10-CM | POA: Diagnosis not present

## 2019-04-20 DIAGNOSIS — E7800 Pure hypercholesterolemia, unspecified: Secondary | ICD-10-CM | POA: Diagnosis not present

## 2019-04-20 DIAGNOSIS — I10 Essential (primary) hypertension: Secondary | ICD-10-CM | POA: Diagnosis not present

## 2019-04-26 DIAGNOSIS — G8929 Other chronic pain: Secondary | ICD-10-CM | POA: Diagnosis not present

## 2019-04-26 DIAGNOSIS — M961 Postlaminectomy syndrome, not elsewhere classified: Secondary | ICD-10-CM | POA: Diagnosis not present

## 2019-05-03 DIAGNOSIS — R1084 Generalized abdominal pain: Secondary | ICD-10-CM | POA: Diagnosis not present

## 2019-05-03 DIAGNOSIS — R5381 Other malaise: Secondary | ICD-10-CM | POA: Diagnosis not present

## 2019-05-05 DIAGNOSIS — M25511 Pain in right shoulder: Secondary | ICD-10-CM | POA: Diagnosis not present

## 2019-05-11 DIAGNOSIS — J4540 Moderate persistent asthma, uncomplicated: Secondary | ICD-10-CM | POA: Diagnosis not present

## 2019-05-24 DIAGNOSIS — M961 Postlaminectomy syndrome, not elsewhere classified: Secondary | ICD-10-CM | POA: Diagnosis not present

## 2019-06-10 DIAGNOSIS — J4540 Moderate persistent asthma, uncomplicated: Secondary | ICD-10-CM | POA: Diagnosis not present

## 2019-06-13 DIAGNOSIS — N529 Male erectile dysfunction, unspecified: Secondary | ICD-10-CM | POA: Diagnosis not present

## 2019-06-13 DIAGNOSIS — I10 Essential (primary) hypertension: Secondary | ICD-10-CM | POA: Diagnosis not present

## 2019-06-13 DIAGNOSIS — E291 Testicular hypofunction: Secondary | ICD-10-CM | POA: Diagnosis not present

## 2019-06-21 DIAGNOSIS — R0600 Dyspnea, unspecified: Secondary | ICD-10-CM | POA: Diagnosis not present

## 2019-06-21 DIAGNOSIS — M961 Postlaminectomy syndrome, not elsewhere classified: Secondary | ICD-10-CM | POA: Diagnosis not present

## 2019-07-11 DIAGNOSIS — J4540 Moderate persistent asthma, uncomplicated: Secondary | ICD-10-CM | POA: Diagnosis not present

## 2019-07-19 DIAGNOSIS — M961 Postlaminectomy syndrome, not elsewhere classified: Secondary | ICD-10-CM | POA: Diagnosis not present

## 2019-07-19 DIAGNOSIS — G8929 Other chronic pain: Secondary | ICD-10-CM | POA: Diagnosis not present

## 2019-07-22 DIAGNOSIS — E669 Obesity, unspecified: Secondary | ICD-10-CM | POA: Diagnosis not present

## 2019-07-27 DIAGNOSIS — M19011 Primary osteoarthritis, right shoulder: Secondary | ICD-10-CM | POA: Diagnosis not present

## 2019-08-04 DIAGNOSIS — J4531 Mild persistent asthma with (acute) exacerbation: Secondary | ICD-10-CM | POA: Diagnosis not present

## 2019-08-04 DIAGNOSIS — J0100 Acute maxillary sinusitis, unspecified: Secondary | ICD-10-CM | POA: Diagnosis not present

## 2019-08-04 DIAGNOSIS — Z7189 Other specified counseling: Secondary | ICD-10-CM | POA: Diagnosis not present

## 2019-08-10 DIAGNOSIS — J4540 Moderate persistent asthma, uncomplicated: Secondary | ICD-10-CM | POA: Diagnosis not present

## 2019-08-11 ENCOUNTER — Ambulatory Visit: Payer: Self-pay | Admitting: Allergy and Immunology

## 2019-08-16 DIAGNOSIS — M961 Postlaminectomy syndrome, not elsewhere classified: Secondary | ICD-10-CM | POA: Diagnosis not present

## 2019-09-10 DIAGNOSIS — J4540 Moderate persistent asthma, uncomplicated: Secondary | ICD-10-CM | POA: Diagnosis not present

## 2019-09-12 DIAGNOSIS — E669 Obesity, unspecified: Secondary | ICD-10-CM | POA: Diagnosis not present

## 2019-09-12 DIAGNOSIS — Z6835 Body mass index (BMI) 35.0-35.9, adult: Secondary | ICD-10-CM | POA: Diagnosis not present

## 2019-09-12 DIAGNOSIS — J4530 Mild persistent asthma, uncomplicated: Secondary | ICD-10-CM | POA: Diagnosis not present

## 2019-09-12 DIAGNOSIS — K219 Gastro-esophageal reflux disease without esophagitis: Secondary | ICD-10-CM | POA: Diagnosis not present

## 2019-09-12 DIAGNOSIS — I10 Essential (primary) hypertension: Secondary | ICD-10-CM | POA: Diagnosis not present

## 2019-09-13 DIAGNOSIS — M961 Postlaminectomy syndrome, not elsewhere classified: Secondary | ICD-10-CM | POA: Diagnosis not present

## 2019-10-11 DIAGNOSIS — J4540 Moderate persistent asthma, uncomplicated: Secondary | ICD-10-CM | POA: Diagnosis not present

## 2019-10-11 DIAGNOSIS — R05 Cough: Secondary | ICD-10-CM | POA: Diagnosis not present

## 2019-10-11 DIAGNOSIS — R600 Localized edema: Secondary | ICD-10-CM | POA: Diagnosis not present

## 2019-10-11 DIAGNOSIS — R1110 Vomiting, unspecified: Secondary | ICD-10-CM | POA: Diagnosis not present

## 2019-10-26 DIAGNOSIS — R05 Cough: Secondary | ICD-10-CM | POA: Diagnosis not present

## 2019-10-26 DIAGNOSIS — R0602 Shortness of breath: Secondary | ICD-10-CM | POA: Diagnosis not present

## 2019-10-26 DIAGNOSIS — Z20828 Contact with and (suspected) exposure to other viral communicable diseases: Secondary | ICD-10-CM | POA: Diagnosis not present

## 2019-11-07 DIAGNOSIS — M961 Postlaminectomy syndrome, not elsewhere classified: Secondary | ICD-10-CM | POA: Diagnosis not present

## 2019-11-08 DIAGNOSIS — G8929 Other chronic pain: Secondary | ICD-10-CM | POA: Diagnosis not present

## 2019-11-10 DIAGNOSIS — M19011 Primary osteoarthritis, right shoulder: Secondary | ICD-10-CM | POA: Diagnosis not present

## 2019-11-10 DIAGNOSIS — J4540 Moderate persistent asthma, uncomplicated: Secondary | ICD-10-CM | POA: Diagnosis not present

## 2019-11-14 DIAGNOSIS — K219 Gastro-esophageal reflux disease without esophagitis: Secondary | ICD-10-CM | POA: Diagnosis not present

## 2019-11-14 DIAGNOSIS — E7800 Pure hypercholesterolemia, unspecified: Secondary | ICD-10-CM | POA: Diagnosis not present

## 2019-11-14 DIAGNOSIS — I10 Essential (primary) hypertension: Secondary | ICD-10-CM | POA: Diagnosis not present

## 2019-11-14 DIAGNOSIS — J4530 Mild persistent asthma, uncomplicated: Secondary | ICD-10-CM | POA: Diagnosis not present

## 2019-11-14 DIAGNOSIS — R7301 Impaired fasting glucose: Secondary | ICD-10-CM | POA: Diagnosis not present

## 2019-11-14 DIAGNOSIS — E669 Obesity, unspecified: Secondary | ICD-10-CM | POA: Diagnosis not present

## 2019-11-14 DIAGNOSIS — R600 Localized edema: Secondary | ICD-10-CM | POA: Diagnosis not present

## 2019-11-14 DIAGNOSIS — Z6834 Body mass index (BMI) 34.0-34.9, adult: Secondary | ICD-10-CM | POA: Diagnosis not present

## 2019-11-15 DIAGNOSIS — D485 Neoplasm of uncertain behavior of skin: Secondary | ICD-10-CM | POA: Diagnosis not present

## 2019-11-15 DIAGNOSIS — L280 Lichen simplex chronicus: Secondary | ICD-10-CM | POA: Diagnosis not present

## 2019-12-06 DIAGNOSIS — M961 Postlaminectomy syndrome, not elsewhere classified: Secondary | ICD-10-CM | POA: Diagnosis not present

## 2019-12-11 DIAGNOSIS — J4540 Moderate persistent asthma, uncomplicated: Secondary | ICD-10-CM | POA: Diagnosis not present

## 2019-12-12 DIAGNOSIS — R7303 Prediabetes: Secondary | ICD-10-CM | POA: Diagnosis not present

## 2019-12-12 DIAGNOSIS — Z0000 Encounter for general adult medical examination without abnormal findings: Secondary | ICD-10-CM | POA: Diagnosis not present

## 2019-12-12 DIAGNOSIS — Z6835 Body mass index (BMI) 35.0-35.9, adult: Secondary | ICD-10-CM | POA: Diagnosis not present

## 2019-12-12 DIAGNOSIS — E669 Obesity, unspecified: Secondary | ICD-10-CM | POA: Diagnosis not present

## 2019-12-17 DIAGNOSIS — R918 Other nonspecific abnormal finding of lung field: Secondary | ICD-10-CM | POA: Diagnosis not present

## 2019-12-17 DIAGNOSIS — L03114 Cellulitis of left upper limb: Secondary | ICD-10-CM | POA: Diagnosis not present

## 2019-12-17 DIAGNOSIS — M25522 Pain in left elbow: Secondary | ICD-10-CM | POA: Diagnosis not present

## 2019-12-17 DIAGNOSIS — Z9889 Other specified postprocedural states: Secondary | ICD-10-CM | POA: Diagnosis not present

## 2019-12-17 DIAGNOSIS — J410 Simple chronic bronchitis: Secondary | ICD-10-CM | POA: Diagnosis not present

## 2019-12-17 DIAGNOSIS — I700 Atherosclerosis of aorta: Secondary | ICD-10-CM | POA: Diagnosis not present

## 2019-12-17 DIAGNOSIS — M7989 Other specified soft tissue disorders: Secondary | ICD-10-CM | POA: Diagnosis not present

## 2019-12-17 DIAGNOSIS — M7022 Olecranon bursitis, left elbow: Secondary | ICD-10-CM | POA: Diagnosis not present

## 2019-12-18 DIAGNOSIS — M7989 Other specified soft tissue disorders: Secondary | ICD-10-CM | POA: Diagnosis not present

## 2019-12-18 DIAGNOSIS — R918 Other nonspecific abnormal finding of lung field: Secondary | ICD-10-CM | POA: Diagnosis not present

## 2019-12-18 DIAGNOSIS — Z9889 Other specified postprocedural states: Secondary | ICD-10-CM | POA: Diagnosis not present

## 2019-12-18 DIAGNOSIS — I700 Atherosclerosis of aorta: Secondary | ICD-10-CM | POA: Diagnosis not present

## 2019-12-18 DIAGNOSIS — M25522 Pain in left elbow: Secondary | ICD-10-CM | POA: Diagnosis not present

## 2019-12-20 DIAGNOSIS — L03114 Cellulitis of left upper limb: Secondary | ICD-10-CM | POA: Diagnosis not present

## 2019-12-21 DIAGNOSIS — L03114 Cellulitis of left upper limb: Secondary | ICD-10-CM | POA: Diagnosis not present

## 2020-01-03 DIAGNOSIS — M961 Postlaminectomy syndrome, not elsewhere classified: Secondary | ICD-10-CM | POA: Diagnosis not present

## 2020-01-03 IMAGING — DX DG CHEST 2V
2 series · 2 of 2 positions shown · non-contrast
Comparison: 03/07/2017

CLINICAL DATA: Shortness of breath

EXAM:
CHEST  2 VIEW

[chest pa]
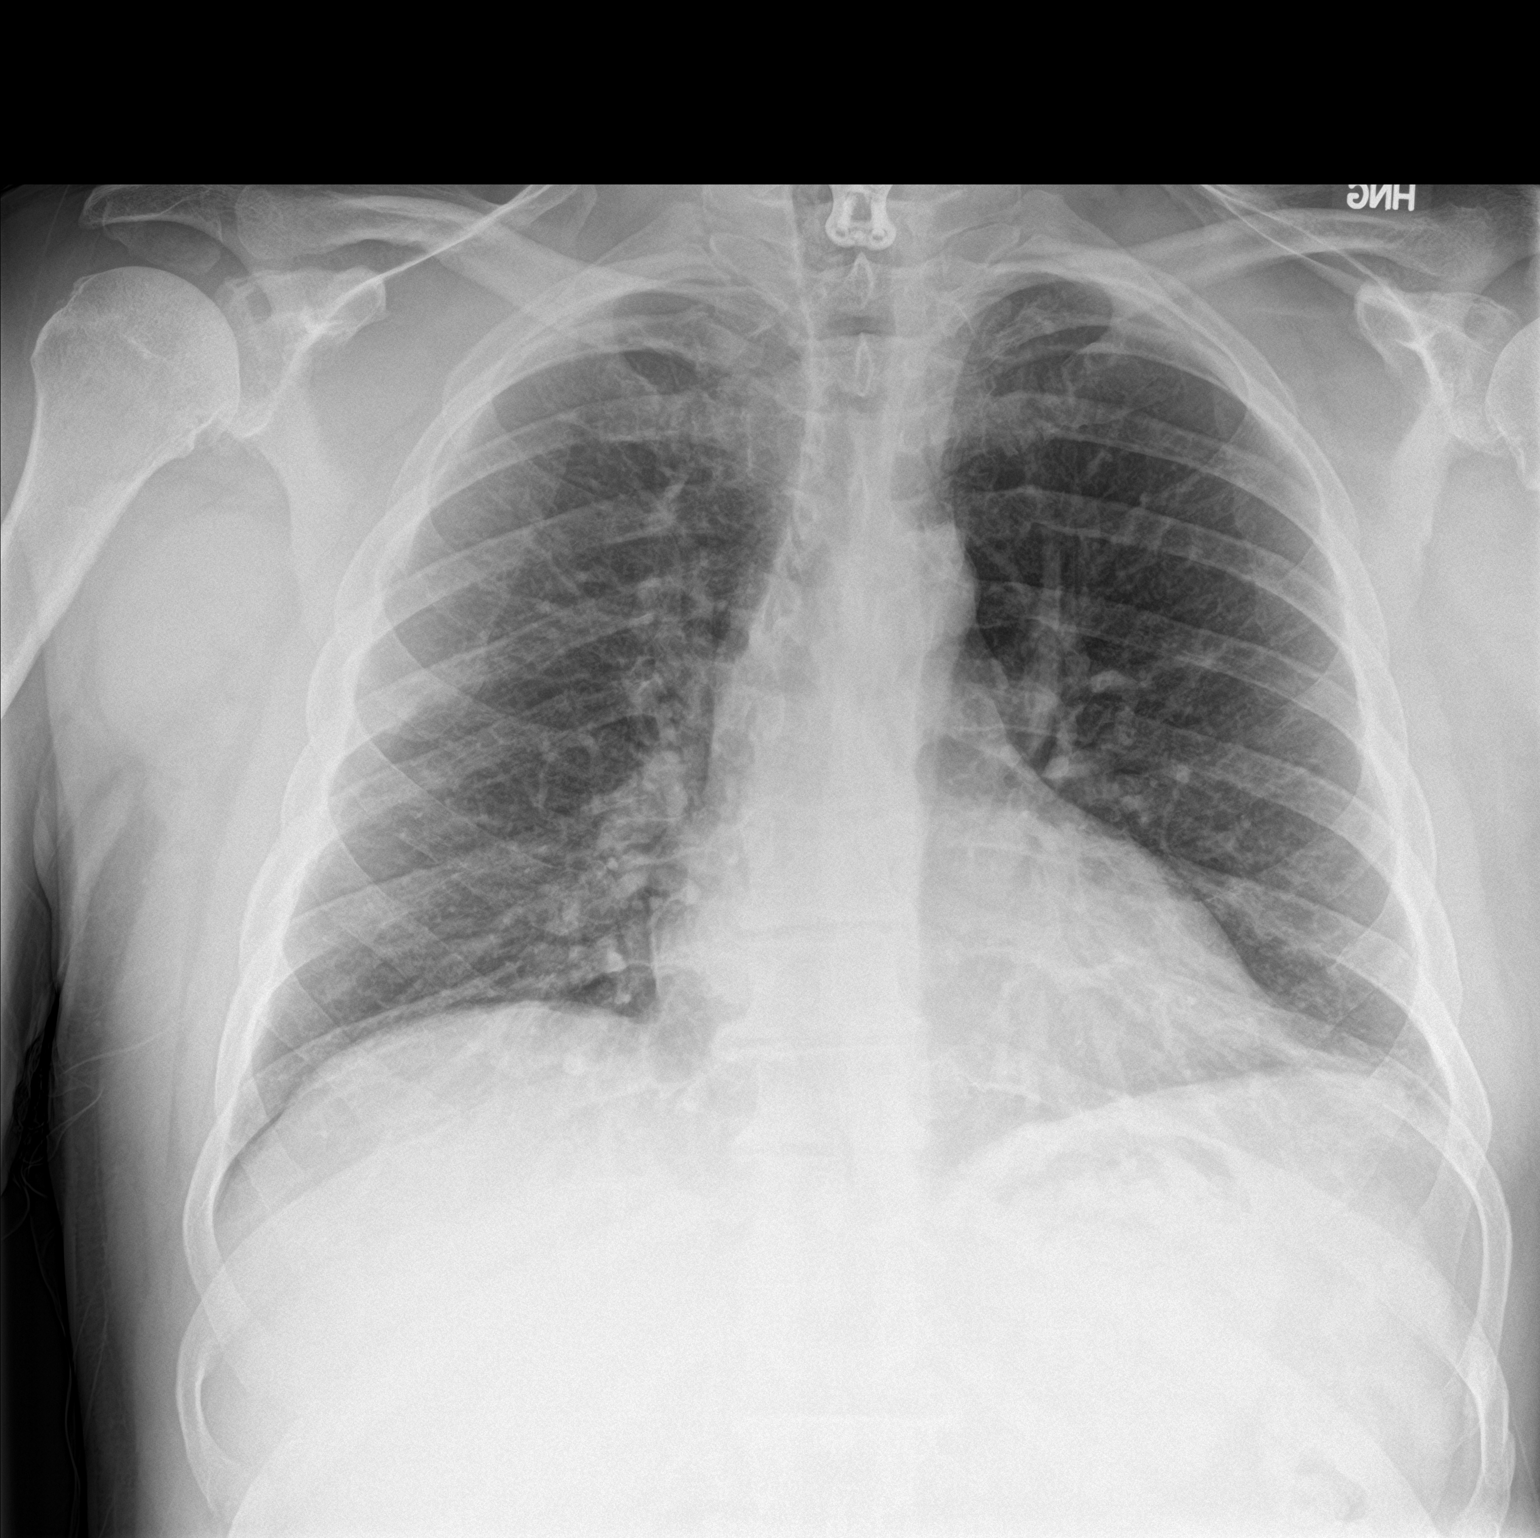

[chest lat]
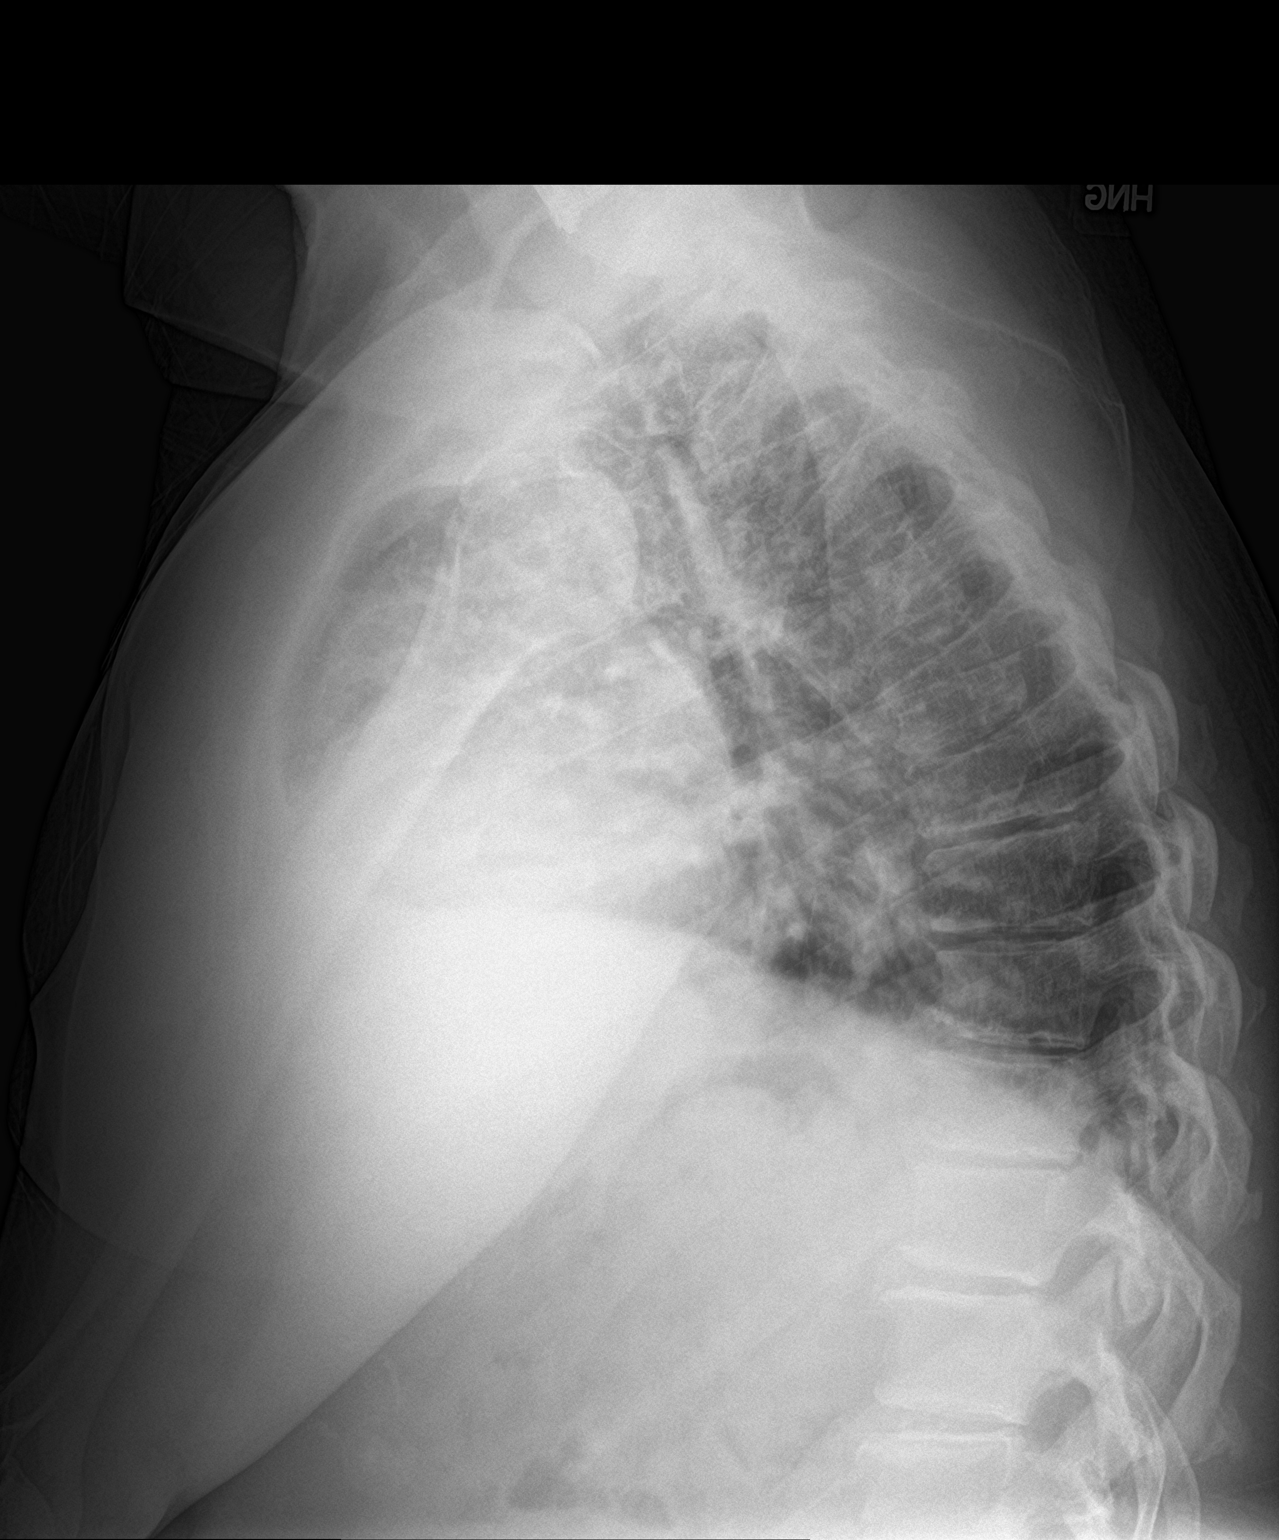

[2 of 2 positions shown; findings below may reference images not displayed]

FINDINGS: No significant pleural effusion. Streaky atelectasis at the left
lung base. Cardiomediastinal silhouette within normal limits. No
pneumothorax. Postsurgical changes of the cervical spine.
IMPRESSION: 1. Streaky atelectasis at the lingula and left base.
2. No radiographic evidence for acute cardiopulmonary abnormality.

## 2020-01-09 DIAGNOSIS — Z5321 Procedure and treatment not carried out due to patient leaving prior to being seen by health care provider: Secondary | ICD-10-CM | POA: Diagnosis not present

## 2020-01-09 DIAGNOSIS — M7989 Other specified soft tissue disorders: Secondary | ICD-10-CM | POA: Diagnosis not present

## 2020-01-10 DIAGNOSIS — R600 Localized edema: Secondary | ICD-10-CM | POA: Diagnosis not present

## 2020-01-10 DIAGNOSIS — J4540 Moderate persistent asthma, uncomplicated: Secondary | ICD-10-CM | POA: Diagnosis not present

## 2020-01-10 DIAGNOSIS — L03114 Cellulitis of left upper limb: Secondary | ICD-10-CM | POA: Diagnosis not present

## 2020-01-15 DIAGNOSIS — R918 Other nonspecific abnormal finding of lung field: Secondary | ICD-10-CM | POA: Diagnosis not present

## 2020-01-15 DIAGNOSIS — R600 Localized edema: Secondary | ICD-10-CM | POA: Diagnosis not present

## 2020-01-15 DIAGNOSIS — I10 Essential (primary) hypertension: Secondary | ICD-10-CM | POA: Diagnosis not present

## 2020-01-16 DIAGNOSIS — R918 Other nonspecific abnormal finding of lung field: Secondary | ICD-10-CM | POA: Diagnosis not present

## 2020-01-19 DIAGNOSIS — R600 Localized edema: Secondary | ICD-10-CM | POA: Diagnosis not present

## 2020-01-19 DIAGNOSIS — J069 Acute upper respiratory infection, unspecified: Secondary | ICD-10-CM | POA: Diagnosis not present

## 2020-01-31 DIAGNOSIS — G8929 Other chronic pain: Secondary | ICD-10-CM | POA: Diagnosis not present

## 2020-01-31 DIAGNOSIS — M961 Postlaminectomy syndrome, not elsewhere classified: Secondary | ICD-10-CM | POA: Diagnosis not present

## 2020-02-10 DIAGNOSIS — J4540 Moderate persistent asthma, uncomplicated: Secondary | ICD-10-CM | POA: Diagnosis not present

## 2020-02-13 DIAGNOSIS — M19011 Primary osteoarthritis, right shoulder: Secondary | ICD-10-CM | POA: Diagnosis not present

## 2020-02-28 DIAGNOSIS — M961 Postlaminectomy syndrome, not elsewhere classified: Secondary | ICD-10-CM | POA: Diagnosis not present

## 2020-03-05 DIAGNOSIS — Z5321 Procedure and treatment not carried out due to patient leaving prior to being seen by health care provider: Secondary | ICD-10-CM | POA: Diagnosis not present

## 2020-03-05 DIAGNOSIS — I10 Essential (primary) hypertension: Secondary | ICD-10-CM | POA: Diagnosis not present

## 2020-03-06 DIAGNOSIS — I10 Essential (primary) hypertension: Secondary | ICD-10-CM | POA: Diagnosis not present

## 2020-03-06 DIAGNOSIS — Z5321 Procedure and treatment not carried out due to patient leaving prior to being seen by health care provider: Secondary | ICD-10-CM | POA: Diagnosis not present

## 2020-03-08 DIAGNOSIS — J069 Acute upper respiratory infection, unspecified: Secondary | ICD-10-CM | POA: Diagnosis not present

## 2020-03-12 DIAGNOSIS — J4540 Moderate persistent asthma, uncomplicated: Secondary | ICD-10-CM | POA: Diagnosis not present

## 2020-03-18 DIAGNOSIS — J1282 Pneumonia due to coronavirus disease 2019: Secondary | ICD-10-CM | POA: Diagnosis not present

## 2020-03-18 DIAGNOSIS — J9601 Acute respiratory failure with hypoxia: Secondary | ICD-10-CM | POA: Diagnosis not present

## 2020-03-18 DIAGNOSIS — J45909 Unspecified asthma, uncomplicated: Secondary | ICD-10-CM | POA: Diagnosis not present

## 2020-03-18 DIAGNOSIS — U071 COVID-19: Secondary | ICD-10-CM | POA: Diagnosis not present

## 2020-03-19 DIAGNOSIS — Z79899 Other long term (current) drug therapy: Secondary | ICD-10-CM | POA: Diagnosis not present

## 2020-03-19 DIAGNOSIS — F419 Anxiety disorder, unspecified: Secondary | ICD-10-CM | POA: Diagnosis not present

## 2020-03-19 DIAGNOSIS — J45909 Unspecified asthma, uncomplicated: Secondary | ICD-10-CM | POA: Diagnosis not present

## 2020-03-19 DIAGNOSIS — E7800 Pure hypercholesterolemia, unspecified: Secondary | ICD-10-CM | POA: Diagnosis not present

## 2020-03-19 DIAGNOSIS — I10 Essential (primary) hypertension: Secondary | ICD-10-CM | POA: Diagnosis not present

## 2020-03-19 DIAGNOSIS — Z7982 Long term (current) use of aspirin: Secondary | ICD-10-CM | POA: Diagnosis not present

## 2020-03-19 DIAGNOSIS — Z792 Long term (current) use of antibiotics: Secondary | ICD-10-CM | POA: Diagnosis not present

## 2020-03-19 DIAGNOSIS — Z885 Allergy status to narcotic agent status: Secondary | ICD-10-CM | POA: Diagnosis not present

## 2020-03-19 DIAGNOSIS — M1990 Unspecified osteoarthritis, unspecified site: Secondary | ICD-10-CM | POA: Diagnosis not present

## 2020-03-19 DIAGNOSIS — G894 Chronic pain syndrome: Secondary | ICD-10-CM | POA: Diagnosis not present

## 2020-03-19 DIAGNOSIS — J9601 Acute respiratory failure with hypoxia: Secondary | ICD-10-CM | POA: Diagnosis not present

## 2020-03-19 DIAGNOSIS — J1282 Pneumonia due to coronavirus disease 2019: Secondary | ICD-10-CM | POA: Diagnosis not present

## 2020-03-19 DIAGNOSIS — U071 COVID-19: Secondary | ICD-10-CM | POA: Diagnosis not present

## 2020-03-19 DIAGNOSIS — E039 Hypothyroidism, unspecified: Secondary | ICD-10-CM | POA: Diagnosis not present

## 2020-03-27 DIAGNOSIS — G8929 Other chronic pain: Secondary | ICD-10-CM | POA: Diagnosis not present

## 2020-03-27 DIAGNOSIS — M961 Postlaminectomy syndrome, not elsewhere classified: Secondary | ICD-10-CM | POA: Diagnosis not present

## 2020-04-01 DIAGNOSIS — K449 Diaphragmatic hernia without obstruction or gangrene: Secondary | ICD-10-CM | POA: Diagnosis not present

## 2020-04-01 DIAGNOSIS — R0600 Dyspnea, unspecified: Secondary | ICD-10-CM | POA: Diagnosis not present

## 2020-04-01 DIAGNOSIS — J189 Pneumonia, unspecified organism: Secondary | ICD-10-CM | POA: Diagnosis not present

## 2020-04-01 DIAGNOSIS — R0602 Shortness of breath: Secondary | ICD-10-CM | POA: Diagnosis not present

## 2020-04-01 DIAGNOSIS — R059 Cough, unspecified: Secondary | ICD-10-CM | POA: Diagnosis not present

## 2020-04-01 DIAGNOSIS — Z8616 Personal history of COVID-19: Secondary | ICD-10-CM | POA: Diagnosis not present

## 2020-04-04 DIAGNOSIS — U099 Post covid-19 condition, unspecified: Secondary | ICD-10-CM | POA: Diagnosis not present

## 2020-04-09 DIAGNOSIS — M1990 Unspecified osteoarthritis, unspecified site: Secondary | ICD-10-CM | POA: Diagnosis not present

## 2020-04-09 DIAGNOSIS — J209 Acute bronchitis, unspecified: Secondary | ICD-10-CM | POA: Diagnosis not present

## 2020-04-09 DIAGNOSIS — Z8616 Personal history of COVID-19: Secondary | ICD-10-CM | POA: Diagnosis not present

## 2020-04-09 DIAGNOSIS — Z79899 Other long term (current) drug therapy: Secondary | ICD-10-CM | POA: Diagnosis not present

## 2020-04-09 DIAGNOSIS — Z87891 Personal history of nicotine dependence: Secondary | ICD-10-CM | POA: Diagnosis not present

## 2020-04-09 DIAGNOSIS — J4540 Moderate persistent asthma, uncomplicated: Secondary | ICD-10-CM | POA: Diagnosis not present

## 2020-04-09 DIAGNOSIS — E7800 Pure hypercholesterolemia, unspecified: Secondary | ICD-10-CM | POA: Diagnosis not present

## 2020-04-09 DIAGNOSIS — G8929 Other chronic pain: Secondary | ICD-10-CM | POA: Diagnosis not present

## 2020-04-09 DIAGNOSIS — I10 Essential (primary) hypertension: Secondary | ICD-10-CM | POA: Diagnosis not present

## 2020-04-09 DIAGNOSIS — R059 Cough, unspecified: Secondary | ICD-10-CM | POA: Diagnosis not present

## 2020-04-19 DIAGNOSIS — E669 Obesity, unspecified: Secondary | ICD-10-CM | POA: Diagnosis not present

## 2020-04-19 DIAGNOSIS — Z6834 Body mass index (BMI) 34.0-34.9, adult: Secondary | ICD-10-CM | POA: Diagnosis not present

## 2020-04-19 DIAGNOSIS — Z79899 Other long term (current) drug therapy: Secondary | ICD-10-CM | POA: Diagnosis not present

## 2020-04-19 DIAGNOSIS — R600 Localized edema: Secondary | ICD-10-CM | POA: Diagnosis not present

## 2020-04-24 DIAGNOSIS — M961 Postlaminectomy syndrome, not elsewhere classified: Secondary | ICD-10-CM | POA: Diagnosis not present

## 2020-05-10 DIAGNOSIS — J4540 Moderate persistent asthma, uncomplicated: Secondary | ICD-10-CM | POA: Diagnosis not present

## 2020-05-12 DIAGNOSIS — K449 Diaphragmatic hernia without obstruction or gangrene: Secondary | ICD-10-CM | POA: Diagnosis not present

## 2020-05-12 DIAGNOSIS — R0602 Shortness of breath: Secondary | ICD-10-CM | POA: Diagnosis not present

## 2020-05-12 DIAGNOSIS — Z87891 Personal history of nicotine dependence: Secondary | ICD-10-CM | POA: Diagnosis not present

## 2020-05-12 DIAGNOSIS — J209 Acute bronchitis, unspecified: Secondary | ICD-10-CM | POA: Diagnosis not present

## 2020-05-12 DIAGNOSIS — Z7952 Long term (current) use of systemic steroids: Secondary | ICD-10-CM | POA: Diagnosis not present

## 2020-05-12 DIAGNOSIS — Z79899 Other long term (current) drug therapy: Secondary | ICD-10-CM | POA: Diagnosis not present

## 2020-05-12 DIAGNOSIS — G8929 Other chronic pain: Secondary | ICD-10-CM | POA: Diagnosis not present

## 2020-05-12 DIAGNOSIS — Z79891 Long term (current) use of opiate analgesic: Secondary | ICD-10-CM | POA: Diagnosis not present

## 2020-05-12 DIAGNOSIS — J440 Chronic obstructive pulmonary disease with acute lower respiratory infection: Secondary | ICD-10-CM | POA: Diagnosis not present

## 2020-05-22 DIAGNOSIS — M961 Postlaminectomy syndrome, not elsewhere classified: Secondary | ICD-10-CM | POA: Diagnosis not present

## 2020-05-23 DIAGNOSIS — M19011 Primary osteoarthritis, right shoulder: Secondary | ICD-10-CM | POA: Diagnosis not present

## 2020-06-09 DIAGNOSIS — J4540 Moderate persistent asthma, uncomplicated: Secondary | ICD-10-CM | POA: Diagnosis not present

## 2020-06-19 DIAGNOSIS — M4807 Spinal stenosis, lumbosacral region: Secondary | ICD-10-CM | POA: Diagnosis not present

## 2020-06-19 DIAGNOSIS — M5440 Lumbago with sciatica, unspecified side: Secondary | ICD-10-CM | POA: Diagnosis not present

## 2020-06-19 DIAGNOSIS — M961 Postlaminectomy syndrome, not elsewhere classified: Secondary | ICD-10-CM | POA: Diagnosis not present

## 2020-07-07 DIAGNOSIS — R0602 Shortness of breath: Secondary | ICD-10-CM | POA: Diagnosis not present

## 2020-07-07 DIAGNOSIS — R42 Dizziness and giddiness: Secondary | ICD-10-CM | POA: Diagnosis not present

## 2020-07-08 DIAGNOSIS — R0602 Shortness of breath: Secondary | ICD-10-CM | POA: Diagnosis not present

## 2020-07-08 DIAGNOSIS — R42 Dizziness and giddiness: Secondary | ICD-10-CM | POA: Diagnosis not present

## 2020-07-10 DIAGNOSIS — J4540 Moderate persistent asthma, uncomplicated: Secondary | ICD-10-CM | POA: Diagnosis not present

## 2020-07-17 DIAGNOSIS — M961 Postlaminectomy syndrome, not elsewhere classified: Secondary | ICD-10-CM | POA: Diagnosis not present

## 2020-07-17 DIAGNOSIS — G8929 Other chronic pain: Secondary | ICD-10-CM | POA: Diagnosis not present

## 2020-07-26 DIAGNOSIS — K219 Gastro-esophageal reflux disease without esophagitis: Secondary | ICD-10-CM | POA: Diagnosis not present

## 2020-07-26 DIAGNOSIS — E6609 Other obesity due to excess calories: Secondary | ICD-10-CM | POA: Diagnosis not present

## 2020-07-26 DIAGNOSIS — G4733 Obstructive sleep apnea (adult) (pediatric): Secondary | ICD-10-CM | POA: Diagnosis not present

## 2020-07-26 DIAGNOSIS — I10 Essential (primary) hypertension: Secondary | ICD-10-CM | POA: Diagnosis not present

## 2020-07-26 DIAGNOSIS — E291 Testicular hypofunction: Secondary | ICD-10-CM | POA: Diagnosis not present

## 2020-07-26 DIAGNOSIS — J4530 Mild persistent asthma, uncomplicated: Secondary | ICD-10-CM | POA: Diagnosis not present

## 2020-07-26 DIAGNOSIS — R7301 Impaired fasting glucose: Secondary | ICD-10-CM | POA: Diagnosis not present

## 2020-07-26 DIAGNOSIS — E669 Obesity, unspecified: Secondary | ICD-10-CM | POA: Diagnosis not present

## 2020-07-26 DIAGNOSIS — Z6835 Body mass index (BMI) 35.0-35.9, adult: Secondary | ICD-10-CM | POA: Diagnosis not present

## 2020-07-26 DIAGNOSIS — E7800 Pure hypercholesterolemia, unspecified: Secondary | ICD-10-CM | POA: Diagnosis not present

## 2020-08-14 DIAGNOSIS — M961 Postlaminectomy syndrome, not elsewhere classified: Secondary | ICD-10-CM | POA: Diagnosis not present

## 2020-08-22 DIAGNOSIS — J329 Chronic sinusitis, unspecified: Secondary | ICD-10-CM | POA: Diagnosis not present

## 2020-08-23 DIAGNOSIS — M19011 Primary osteoarthritis, right shoulder: Secondary | ICD-10-CM | POA: Diagnosis not present

## 2020-09-06 DIAGNOSIS — M19011 Primary osteoarthritis, right shoulder: Secondary | ICD-10-CM | POA: Diagnosis not present

## 2020-09-12 DIAGNOSIS — Z20822 Contact with and (suspected) exposure to covid-19: Secondary | ICD-10-CM | POA: Diagnosis not present

## 2020-09-12 DIAGNOSIS — I10 Essential (primary) hypertension: Secondary | ICD-10-CM | POA: Diagnosis not present

## 2020-09-12 DIAGNOSIS — Z5321 Procedure and treatment not carried out due to patient leaving prior to being seen by health care provider: Secondary | ICD-10-CM | POA: Diagnosis not present

## 2020-09-13 DIAGNOSIS — R0600 Dyspnea, unspecified: Secondary | ICD-10-CM | POA: Diagnosis not present

## 2020-09-13 DIAGNOSIS — E7800 Pure hypercholesterolemia, unspecified: Secondary | ICD-10-CM | POA: Diagnosis not present

## 2020-09-13 DIAGNOSIS — I160 Hypertensive urgency: Secondary | ICD-10-CM | POA: Diagnosis not present

## 2020-09-13 DIAGNOSIS — J449 Chronic obstructive pulmonary disease, unspecified: Secondary | ICD-10-CM | POA: Diagnosis not present

## 2020-09-13 DIAGNOSIS — R5383 Other fatigue: Secondary | ICD-10-CM | POA: Diagnosis not present

## 2020-09-13 DIAGNOSIS — I10 Essential (primary) hypertension: Secondary | ICD-10-CM | POA: Diagnosis not present

## 2020-09-18 DIAGNOSIS — J069 Acute upper respiratory infection, unspecified: Secondary | ICD-10-CM | POA: Diagnosis not present

## 2020-09-19 DIAGNOSIS — J069 Acute upper respiratory infection, unspecified: Secondary | ICD-10-CM | POA: Diagnosis not present

## 2020-09-24 DIAGNOSIS — I10 Essential (primary) hypertension: Secondary | ICD-10-CM | POA: Diagnosis not present

## 2020-09-28 DIAGNOSIS — I10 Essential (primary) hypertension: Secondary | ICD-10-CM | POA: Diagnosis not present

## 2020-09-28 DIAGNOSIS — R030 Elevated blood-pressure reading, without diagnosis of hypertension: Secondary | ICD-10-CM | POA: Diagnosis not present

## 2020-10-01 DIAGNOSIS — F419 Anxiety disorder, unspecified: Secondary | ICD-10-CM | POA: Diagnosis not present

## 2020-10-01 DIAGNOSIS — F5101 Primary insomnia: Secondary | ICD-10-CM | POA: Diagnosis not present

## 2020-10-04 DIAGNOSIS — Z5321 Procedure and treatment not carried out due to patient leaving prior to being seen by health care provider: Secondary | ICD-10-CM | POA: Diagnosis not present

## 2020-10-04 DIAGNOSIS — R0609 Other forms of dyspnea: Secondary | ICD-10-CM | POA: Diagnosis not present

## 2020-10-05 DIAGNOSIS — R0609 Other forms of dyspnea: Secondary | ICD-10-CM | POA: Diagnosis not present

## 2020-10-05 DIAGNOSIS — R053 Chronic cough: Secondary | ICD-10-CM | POA: Diagnosis not present

## 2020-10-05 DIAGNOSIS — Z20822 Contact with and (suspected) exposure to covid-19: Secondary | ICD-10-CM | POA: Diagnosis not present

## 2020-10-05 DIAGNOSIS — E119 Type 2 diabetes mellitus without complications: Secondary | ICD-10-CM | POA: Diagnosis not present

## 2020-10-05 DIAGNOSIS — J209 Acute bronchitis, unspecified: Secondary | ICD-10-CM | POA: Diagnosis not present

## 2020-10-05 DIAGNOSIS — R0602 Shortness of breath: Secondary | ICD-10-CM | POA: Diagnosis not present

## 2020-10-05 DIAGNOSIS — Z885 Allergy status to narcotic agent status: Secondary | ICD-10-CM | POA: Diagnosis not present

## 2020-10-05 DIAGNOSIS — R0981 Nasal congestion: Secondary | ICD-10-CM | POA: Diagnosis not present

## 2020-10-05 DIAGNOSIS — R0902 Hypoxemia: Secondary | ICD-10-CM | POA: Diagnosis not present

## 2020-10-05 DIAGNOSIS — R0600 Dyspnea, unspecified: Secondary | ICD-10-CM | POA: Diagnosis not present

## 2020-10-05 DIAGNOSIS — I10 Essential (primary) hypertension: Secondary | ICD-10-CM | POA: Diagnosis not present

## 2020-10-05 DIAGNOSIS — R051 Acute cough: Secondary | ICD-10-CM | POA: Diagnosis not present

## 2020-10-05 DIAGNOSIS — E785 Hyperlipidemia, unspecified: Secondary | ICD-10-CM | POA: Diagnosis not present

## 2020-10-05 DIAGNOSIS — Z6834 Body mass index (BMI) 34.0-34.9, adult: Secondary | ICD-10-CM | POA: Diagnosis not present

## 2020-10-05 DIAGNOSIS — Z79899 Other long term (current) drug therapy: Secondary | ICD-10-CM | POA: Diagnosis not present

## 2020-10-16 DIAGNOSIS — Z5321 Procedure and treatment not carried out due to patient leaving prior to being seen by health care provider: Secondary | ICD-10-CM | POA: Diagnosis not present

## 2020-10-16 DIAGNOSIS — I10 Essential (primary) hypertension: Secondary | ICD-10-CM | POA: Diagnosis not present

## 2020-10-23 DIAGNOSIS — R059 Cough, unspecified: Secondary | ICD-10-CM | POA: Diagnosis not present

## 2020-10-23 DIAGNOSIS — Z5321 Procedure and treatment not carried out due to patient leaving prior to being seen by health care provider: Secondary | ICD-10-CM | POA: Diagnosis not present

## 2020-10-23 DIAGNOSIS — R0602 Shortness of breath: Secondary | ICD-10-CM | POA: Diagnosis not present

## 2020-10-24 DIAGNOSIS — J45909 Unspecified asthma, uncomplicated: Secondary | ICD-10-CM | POA: Diagnosis not present

## 2020-10-24 DIAGNOSIS — J069 Acute upper respiratory infection, unspecified: Secondary | ICD-10-CM | POA: Diagnosis not present

## 2020-10-25 DIAGNOSIS — J4540 Moderate persistent asthma, uncomplicated: Secondary | ICD-10-CM | POA: Diagnosis not present

## 2020-10-25 DIAGNOSIS — I10 Essential (primary) hypertension: Secondary | ICD-10-CM | POA: Diagnosis not present

## 2020-10-25 DIAGNOSIS — F418 Other specified anxiety disorders: Secondary | ICD-10-CM | POA: Diagnosis not present

## 2020-10-25 DIAGNOSIS — J310 Chronic rhinitis: Secondary | ICD-10-CM | POA: Diagnosis not present

## 2020-10-25 DIAGNOSIS — E669 Obesity, unspecified: Secondary | ICD-10-CM | POA: Diagnosis not present

## 2020-10-25 DIAGNOSIS — G4733 Obstructive sleep apnea (adult) (pediatric): Secondary | ICD-10-CM | POA: Diagnosis not present

## 2020-10-25 DIAGNOSIS — Z6836 Body mass index (BMI) 36.0-36.9, adult: Secondary | ICD-10-CM | POA: Diagnosis not present

## 2020-10-30 DIAGNOSIS — Z87898 Personal history of other specified conditions: Secondary | ICD-10-CM | POA: Diagnosis not present

## 2020-10-30 DIAGNOSIS — J45991 Cough variant asthma: Secondary | ICD-10-CM | POA: Diagnosis not present

## 2020-10-30 DIAGNOSIS — J342 Deviated nasal septum: Secondary | ICD-10-CM | POA: Diagnosis not present

## 2020-10-30 DIAGNOSIS — J45909 Unspecified asthma, uncomplicated: Secondary | ICD-10-CM | POA: Diagnosis not present

## 2020-10-30 DIAGNOSIS — J343 Hypertrophy of nasal turbinates: Secondary | ICD-10-CM | POA: Diagnosis not present

## 2020-10-30 DIAGNOSIS — F419 Anxiety disorder, unspecified: Secondary | ICD-10-CM | POA: Diagnosis not present

## 2020-10-30 DIAGNOSIS — K219 Gastro-esophageal reflux disease without esophagitis: Secondary | ICD-10-CM | POA: Diagnosis not present

## 2020-10-30 DIAGNOSIS — R490 Dysphonia: Secondary | ICD-10-CM | POA: Diagnosis not present

## 2020-10-30 DIAGNOSIS — R0981 Nasal congestion: Secondary | ICD-10-CM | POA: Diagnosis not present

## 2020-10-30 DIAGNOSIS — Z7951 Long term (current) use of inhaled steroids: Secondary | ICD-10-CM | POA: Diagnosis not present

## 2020-11-06 DIAGNOSIS — G8929 Other chronic pain: Secondary | ICD-10-CM | POA: Diagnosis not present

## 2020-11-10 DIAGNOSIS — J441 Chronic obstructive pulmonary disease with (acute) exacerbation: Secondary | ICD-10-CM | POA: Diagnosis not present

## 2020-11-10 DIAGNOSIS — J45909 Unspecified asthma, uncomplicated: Secondary | ICD-10-CM | POA: Diagnosis not present

## 2020-12-06 DIAGNOSIS — E291 Testicular hypofunction: Secondary | ICD-10-CM | POA: Diagnosis not present

## 2020-12-06 DIAGNOSIS — J310 Chronic rhinitis: Secondary | ICD-10-CM | POA: Diagnosis not present

## 2020-12-06 DIAGNOSIS — J4540 Moderate persistent asthma, uncomplicated: Secondary | ICD-10-CM | POA: Diagnosis not present

## 2020-12-06 DIAGNOSIS — I10 Essential (primary) hypertension: Secondary | ICD-10-CM | POA: Diagnosis not present

## 2020-12-06 DIAGNOSIS — R739 Hyperglycemia, unspecified: Secondary | ICD-10-CM | POA: Diagnosis not present

## 2020-12-06 DIAGNOSIS — Z79899 Other long term (current) drug therapy: Secondary | ICD-10-CM | POA: Diagnosis not present

## 2020-12-06 DIAGNOSIS — F418 Other specified anxiety disorders: Secondary | ICD-10-CM | POA: Diagnosis not present

## 2020-12-11 DIAGNOSIS — R5383 Other fatigue: Secondary | ICD-10-CM | POA: Diagnosis not present

## 2020-12-11 DIAGNOSIS — R059 Cough, unspecified: Secondary | ICD-10-CM | POA: Diagnosis not present

## 2020-12-11 DIAGNOSIS — G4733 Obstructive sleep apnea (adult) (pediatric): Secondary | ICD-10-CM | POA: Diagnosis not present

## 2020-12-11 DIAGNOSIS — J301 Allergic rhinitis due to pollen: Secondary | ICD-10-CM | POA: Diagnosis not present

## 2020-12-15 DIAGNOSIS — Z20822 Contact with and (suspected) exposure to covid-19: Secondary | ICD-10-CM | POA: Diagnosis not present

## 2020-12-15 DIAGNOSIS — Z5321 Procedure and treatment not carried out due to patient leaving prior to being seen by health care provider: Secondary | ICD-10-CM | POA: Diagnosis not present

## 2020-12-17 DIAGNOSIS — R42 Dizziness and giddiness: Secondary | ICD-10-CM | POA: Diagnosis not present

## 2020-12-19 DIAGNOSIS — Z79899 Other long term (current) drug therapy: Secondary | ICD-10-CM | POA: Diagnosis not present

## 2020-12-19 DIAGNOSIS — R42 Dizziness and giddiness: Secondary | ICD-10-CM | POA: Diagnosis not present

## 2021-01-01 DIAGNOSIS — Z20822 Contact with and (suspected) exposure to covid-19: Secondary | ICD-10-CM | POA: Diagnosis not present

## 2021-01-01 DIAGNOSIS — J209 Acute bronchitis, unspecified: Secondary | ICD-10-CM | POA: Diagnosis not present

## 2021-01-09 DIAGNOSIS — R739 Hyperglycemia, unspecified: Secondary | ICD-10-CM | POA: Diagnosis not present

## 2021-01-09 DIAGNOSIS — E7800 Pure hypercholesterolemia, unspecified: Secondary | ICD-10-CM | POA: Diagnosis not present

## 2021-01-09 DIAGNOSIS — J4540 Moderate persistent asthma, uncomplicated: Secondary | ICD-10-CM | POA: Diagnosis not present

## 2021-01-09 DIAGNOSIS — I10 Essential (primary) hypertension: Secondary | ICD-10-CM | POA: Diagnosis not present

## 2021-01-22 DIAGNOSIS — J029 Acute pharyngitis, unspecified: Secondary | ICD-10-CM | POA: Diagnosis not present

## 2021-01-22 DIAGNOSIS — R0602 Shortness of breath: Secondary | ICD-10-CM | POA: Diagnosis not present

## 2021-01-22 DIAGNOSIS — Z885 Allergy status to narcotic agent status: Secondary | ICD-10-CM | POA: Diagnosis not present

## 2021-01-22 DIAGNOSIS — J189 Pneumonia, unspecified organism: Secondary | ICD-10-CM | POA: Diagnosis not present

## 2021-01-22 DIAGNOSIS — R918 Other nonspecific abnormal finding of lung field: Secondary | ICD-10-CM | POA: Diagnosis not present

## 2021-01-22 DIAGNOSIS — J440 Chronic obstructive pulmonary disease with acute lower respiratory infection: Secondary | ICD-10-CM | POA: Diagnosis not present

## 2021-01-22 DIAGNOSIS — Z79899 Other long term (current) drug therapy: Secondary | ICD-10-CM | POA: Diagnosis not present

## 2021-01-22 DIAGNOSIS — I10 Essential (primary) hypertension: Secondary | ICD-10-CM | POA: Diagnosis not present

## 2021-01-22 DIAGNOSIS — Z20822 Contact with and (suspected) exposure to covid-19: Secondary | ICD-10-CM | POA: Diagnosis not present

## 2021-01-22 DIAGNOSIS — Z6834 Body mass index (BMI) 34.0-34.9, adult: Secondary | ICD-10-CM | POA: Diagnosis not present

## 2021-01-22 DIAGNOSIS — R059 Cough, unspecified: Secondary | ICD-10-CM | POA: Diagnosis not present

## 2021-01-23 DIAGNOSIS — I10 Essential (primary) hypertension: Secondary | ICD-10-CM | POA: Diagnosis not present

## 2021-01-23 DIAGNOSIS — J189 Pneumonia, unspecified organism: Secondary | ICD-10-CM | POA: Diagnosis not present

## 2021-01-23 DIAGNOSIS — J4530 Mild persistent asthma, uncomplicated: Secondary | ICD-10-CM | POA: Diagnosis not present

## 2021-01-23 DIAGNOSIS — R0902 Hypoxemia: Secondary | ICD-10-CM | POA: Diagnosis not present

## 2021-01-23 DIAGNOSIS — Z79899 Other long term (current) drug therapy: Secondary | ICD-10-CM | POA: Diagnosis not present

## 2021-01-23 DIAGNOSIS — R5383 Other fatigue: Secondary | ICD-10-CM | POA: Diagnosis not present

## 2021-01-23 DIAGNOSIS — Z6834 Body mass index (BMI) 34.0-34.9, adult: Secondary | ICD-10-CM | POA: Diagnosis not present

## 2021-01-23 DIAGNOSIS — J45909 Unspecified asthma, uncomplicated: Secondary | ICD-10-CM | POA: Diagnosis not present

## 2021-01-23 DIAGNOSIS — J301 Allergic rhinitis due to pollen: Secondary | ICD-10-CM | POA: Diagnosis not present

## 2021-01-23 DIAGNOSIS — R0602 Shortness of breath: Secondary | ICD-10-CM | POA: Diagnosis not present

## 2021-01-23 DIAGNOSIS — R059 Cough, unspecified: Secondary | ICD-10-CM | POA: Diagnosis not present

## 2021-01-23 DIAGNOSIS — Z885 Allergy status to narcotic agent status: Secondary | ICD-10-CM | POA: Diagnosis not present

## 2021-01-23 DIAGNOSIS — G4733 Obstructive sleep apnea (adult) (pediatric): Secondary | ICD-10-CM | POA: Diagnosis not present

## 2021-01-23 DIAGNOSIS — K449 Diaphragmatic hernia without obstruction or gangrene: Secondary | ICD-10-CM | POA: Diagnosis not present

## 2021-01-24 DIAGNOSIS — R0602 Shortness of breath: Secondary | ICD-10-CM | POA: Diagnosis not present

## 2021-01-24 DIAGNOSIS — J189 Pneumonia, unspecified organism: Secondary | ICD-10-CM | POA: Diagnosis not present

## 2021-01-24 DIAGNOSIS — R059 Cough, unspecified: Secondary | ICD-10-CM | POA: Diagnosis not present

## 2021-01-24 DIAGNOSIS — R0902 Hypoxemia: Secondary | ICD-10-CM | POA: Diagnosis not present

## 2021-01-27 DIAGNOSIS — R059 Cough, unspecified: Secondary | ICD-10-CM | POA: Diagnosis not present

## 2021-01-28 DIAGNOSIS — I10 Essential (primary) hypertension: Secondary | ICD-10-CM | POA: Diagnosis not present

## 2021-01-28 DIAGNOSIS — K0381 Cracked tooth: Secondary | ICD-10-CM | POA: Diagnosis not present

## 2021-01-28 DIAGNOSIS — Z0000 Encounter for general adult medical examination without abnormal findings: Secondary | ICD-10-CM | POA: Diagnosis not present

## 2021-01-28 DIAGNOSIS — R7303 Prediabetes: Secondary | ICD-10-CM | POA: Diagnosis not present

## 2021-01-28 DIAGNOSIS — Z86010 Personal history of colonic polyps: Secondary | ICD-10-CM | POA: Diagnosis not present

## 2021-01-28 DIAGNOSIS — Z125 Encounter for screening for malignant neoplasm of prostate: Secondary | ICD-10-CM | POA: Diagnosis not present

## 2021-01-28 DIAGNOSIS — Z6835 Body mass index (BMI) 35.0-35.9, adult: Secondary | ICD-10-CM | POA: Diagnosis not present

## 2021-01-28 DIAGNOSIS — R053 Chronic cough: Secondary | ICD-10-CM | POA: Diagnosis not present

## 2021-01-28 DIAGNOSIS — J4540 Moderate persistent asthma, uncomplicated: Secondary | ICD-10-CM | POA: Diagnosis not present

## 2021-01-29 DIAGNOSIS — M542 Cervicalgia: Secondary | ICD-10-CM | POA: Diagnosis not present

## 2021-02-01 DIAGNOSIS — J189 Pneumonia, unspecified organism: Secondary | ICD-10-CM | POA: Diagnosis not present

## 2021-02-01 DIAGNOSIS — R051 Acute cough: Secondary | ICD-10-CM | POA: Diagnosis not present

## 2021-02-01 DIAGNOSIS — R519 Headache, unspecified: Secondary | ICD-10-CM | POA: Diagnosis not present

## 2021-02-08 DIAGNOSIS — I10 Essential (primary) hypertension: Secondary | ICD-10-CM | POA: Diagnosis not present

## 2021-02-08 DIAGNOSIS — E7800 Pure hypercholesterolemia, unspecified: Secondary | ICD-10-CM | POA: Diagnosis not present

## 2021-02-12 DIAGNOSIS — G4733 Obstructive sleep apnea (adult) (pediatric): Secondary | ICD-10-CM | POA: Diagnosis not present

## 2021-02-12 DIAGNOSIS — R5383 Other fatigue: Secondary | ICD-10-CM | POA: Diagnosis not present

## 2021-02-12 DIAGNOSIS — J301 Allergic rhinitis due to pollen: Secondary | ICD-10-CM | POA: Diagnosis not present

## 2021-02-12 DIAGNOSIS — J4530 Mild persistent asthma, uncomplicated: Secondary | ICD-10-CM | POA: Diagnosis not present

## 2021-02-18 DIAGNOSIS — Z6837 Body mass index (BMI) 37.0-37.9, adult: Secondary | ICD-10-CM | POA: Diagnosis not present

## 2021-02-18 DIAGNOSIS — Z01810 Encounter for preprocedural cardiovascular examination: Secondary | ICD-10-CM | POA: Diagnosis not present

## 2021-02-21 DIAGNOSIS — M542 Cervicalgia: Secondary | ICD-10-CM | POA: Diagnosis not present

## 2021-02-21 DIAGNOSIS — F909 Attention-deficit hyperactivity disorder, unspecified type: Secondary | ICD-10-CM | POA: Diagnosis not present

## 2021-02-21 DIAGNOSIS — B9789 Other viral agents as the cause of diseases classified elsewhere: Secondary | ICD-10-CM | POA: Diagnosis not present

## 2021-02-21 DIAGNOSIS — R079 Chest pain, unspecified: Secondary | ICD-10-CM | POA: Diagnosis not present

## 2021-02-21 DIAGNOSIS — K219 Gastro-esophageal reflux disease without esophagitis: Secondary | ICD-10-CM | POA: Diagnosis not present

## 2021-02-21 DIAGNOSIS — Z79899 Other long term (current) drug therapy: Secondary | ICD-10-CM | POA: Diagnosis not present

## 2021-02-21 DIAGNOSIS — S29011A Strain of muscle and tendon of front wall of thorax, initial encounter: Secondary | ICD-10-CM | POA: Diagnosis not present

## 2021-02-21 DIAGNOSIS — E785 Hyperlipidemia, unspecified: Secondary | ICD-10-CM | POA: Diagnosis not present

## 2021-02-21 DIAGNOSIS — J984 Other disorders of lung: Secondary | ICD-10-CM | POA: Diagnosis not present

## 2021-02-21 DIAGNOSIS — J45909 Unspecified asthma, uncomplicated: Secondary | ICD-10-CM | POA: Diagnosis not present

## 2021-02-21 DIAGNOSIS — J069 Acute upper respiratory infection, unspecified: Secondary | ICD-10-CM | POA: Diagnosis not present

## 2021-02-21 DIAGNOSIS — T402X5A Adverse effect of other opioids, initial encounter: Secondary | ICD-10-CM | POA: Diagnosis not present

## 2021-02-21 DIAGNOSIS — K5903 Drug induced constipation: Secondary | ICD-10-CM | POA: Diagnosis not present

## 2021-02-21 DIAGNOSIS — E668 Other obesity: Secondary | ICD-10-CM | POA: Diagnosis not present

## 2021-02-21 DIAGNOSIS — G8929 Other chronic pain: Secondary | ICD-10-CM | POA: Diagnosis not present

## 2021-02-21 DIAGNOSIS — K5900 Constipation, unspecified: Secondary | ICD-10-CM | POA: Diagnosis not present

## 2021-02-21 DIAGNOSIS — Z79891 Long term (current) use of opiate analgesic: Secondary | ICD-10-CM | POA: Diagnosis not present

## 2021-02-21 DIAGNOSIS — Z20822 Contact with and (suspected) exposure to covid-19: Secondary | ICD-10-CM | POA: Diagnosis not present

## 2021-02-21 DIAGNOSIS — Z808 Family history of malignant neoplasm of other organs or systems: Secondary | ICD-10-CM | POA: Diagnosis not present

## 2021-02-21 DIAGNOSIS — Z6834 Body mass index (BMI) 34.0-34.9, adult: Secondary | ICD-10-CM | POA: Diagnosis not present

## 2021-02-21 DIAGNOSIS — I10 Essential (primary) hypertension: Secondary | ICD-10-CM | POA: Diagnosis not present

## 2021-02-21 DIAGNOSIS — R059 Cough, unspecified: Secondary | ICD-10-CM | POA: Diagnosis not present

## 2021-02-21 DIAGNOSIS — Z6835 Body mass index (BMI) 35.0-35.9, adult: Secondary | ICD-10-CM | POA: Diagnosis not present

## 2021-02-21 DIAGNOSIS — Z7984 Long term (current) use of oral hypoglycemic drugs: Secondary | ICD-10-CM | POA: Diagnosis not present

## 2021-02-21 DIAGNOSIS — S29012A Strain of muscle and tendon of back wall of thorax, initial encounter: Secondary | ICD-10-CM | POA: Diagnosis not present

## 2021-02-21 DIAGNOSIS — R918 Other nonspecific abnormal finding of lung field: Secondary | ICD-10-CM | POA: Diagnosis not present

## 2021-02-25 DIAGNOSIS — I160 Hypertensive urgency: Secondary | ICD-10-CM | POA: Diagnosis not present

## 2021-02-25 DIAGNOSIS — K449 Diaphragmatic hernia without obstruction or gangrene: Secondary | ICD-10-CM | POA: Diagnosis not present

## 2021-02-25 DIAGNOSIS — Z20822 Contact with and (suspected) exposure to covid-19: Secondary | ICD-10-CM | POA: Diagnosis not present

## 2021-02-25 DIAGNOSIS — R0602 Shortness of breath: Secondary | ICD-10-CM | POA: Diagnosis not present

## 2021-02-25 DIAGNOSIS — J029 Acute pharyngitis, unspecified: Secondary | ICD-10-CM | POA: Diagnosis not present

## 2021-02-25 DIAGNOSIS — J9601 Acute respiratory failure with hypoxia: Secondary | ICD-10-CM | POA: Diagnosis not present

## 2021-02-25 DIAGNOSIS — J329 Chronic sinusitis, unspecified: Secondary | ICD-10-CM | POA: Diagnosis not present

## 2021-02-25 DIAGNOSIS — E785 Hyperlipidemia, unspecified: Secondary | ICD-10-CM | POA: Diagnosis not present

## 2021-02-25 DIAGNOSIS — S29011A Strain of muscle and tendon of front wall of thorax, initial encounter: Secondary | ICD-10-CM | POA: Diagnosis not present

## 2021-02-25 DIAGNOSIS — B341 Enterovirus infection, unspecified: Secondary | ICD-10-CM | POA: Diagnosis not present

## 2021-02-25 DIAGNOSIS — I10 Essential (primary) hypertension: Secondary | ICD-10-CM | POA: Diagnosis not present

## 2021-02-25 DIAGNOSIS — R112 Nausea with vomiting, unspecified: Secondary | ICD-10-CM | POA: Diagnosis not present

## 2021-02-25 DIAGNOSIS — Z885 Allergy status to narcotic agent status: Secondary | ICD-10-CM | POA: Diagnosis not present

## 2021-02-25 DIAGNOSIS — R918 Other nonspecific abnormal finding of lung field: Secondary | ICD-10-CM | POA: Diagnosis not present

## 2021-02-25 DIAGNOSIS — Z7984 Long term (current) use of oral hypoglycemic drugs: Secondary | ICD-10-CM | POA: Diagnosis not present

## 2021-02-25 DIAGNOSIS — Z79899 Other long term (current) drug therapy: Secondary | ICD-10-CM | POA: Diagnosis not present

## 2021-02-25 DIAGNOSIS — J9811 Atelectasis: Secondary | ICD-10-CM | POA: Diagnosis not present

## 2021-02-25 DIAGNOSIS — R42 Dizziness and giddiness: Secondary | ICD-10-CM | POA: Diagnosis not present

## 2021-02-25 DIAGNOSIS — Z6834 Body mass index (BMI) 34.0-34.9, adult: Secondary | ICD-10-CM | POA: Diagnosis not present

## 2021-02-25 DIAGNOSIS — R0982 Postnasal drip: Secondary | ICD-10-CM | POA: Diagnosis not present

## 2021-02-25 DIAGNOSIS — G894 Chronic pain syndrome: Secondary | ICD-10-CM | POA: Diagnosis not present

## 2021-02-25 DIAGNOSIS — R7303 Prediabetes: Secondary | ICD-10-CM | POA: Diagnosis not present

## 2021-02-25 DIAGNOSIS — E871 Hypo-osmolality and hyponatremia: Secondary | ICD-10-CM | POA: Diagnosis not present

## 2021-02-25 DIAGNOSIS — Z8616 Personal history of COVID-19: Secondary | ICD-10-CM | POA: Diagnosis not present

## 2021-02-25 DIAGNOSIS — R059 Cough, unspecified: Secondary | ICD-10-CM | POA: Diagnosis not present

## 2021-02-25 DIAGNOSIS — J0100 Acute maxillary sinusitis, unspecified: Secondary | ICD-10-CM | POA: Diagnosis not present

## 2021-02-25 DIAGNOSIS — J111 Influenza due to unidentified influenza virus with other respiratory manifestations: Secondary | ICD-10-CM | POA: Diagnosis not present

## 2021-02-26 DIAGNOSIS — E871 Hypo-osmolality and hyponatremia: Secondary | ICD-10-CM | POA: Diagnosis not present

## 2021-02-26 DIAGNOSIS — E662 Morbid (severe) obesity with alveolar hypoventilation: Secondary | ICD-10-CM | POA: Diagnosis not present

## 2021-02-26 DIAGNOSIS — J9601 Acute respiratory failure with hypoxia: Secondary | ICD-10-CM | POA: Diagnosis not present

## 2021-02-26 DIAGNOSIS — J45901 Unspecified asthma with (acute) exacerbation: Secondary | ICD-10-CM | POA: Diagnosis not present

## 2021-02-26 DIAGNOSIS — J0100 Acute maxillary sinusitis, unspecified: Secondary | ICD-10-CM | POA: Diagnosis not present

## 2021-02-26 DIAGNOSIS — U099 Post covid-19 condition, unspecified: Secondary | ICD-10-CM | POA: Diagnosis not present

## 2021-02-26 DIAGNOSIS — J329 Chronic sinusitis, unspecified: Secondary | ICD-10-CM | POA: Diagnosis not present

## 2021-02-26 DIAGNOSIS — I361 Nonrheumatic tricuspid (valve) insufficiency: Secondary | ICD-10-CM | POA: Diagnosis not present

## 2021-02-27 DIAGNOSIS — E662 Morbid (severe) obesity with alveolar hypoventilation: Secondary | ICD-10-CM | POA: Diagnosis not present

## 2021-02-27 DIAGNOSIS — J9601 Acute respiratory failure with hypoxia: Secondary | ICD-10-CM | POA: Diagnosis not present

## 2021-02-27 DIAGNOSIS — J0100 Acute maxillary sinusitis, unspecified: Secondary | ICD-10-CM | POA: Diagnosis not present

## 2021-02-27 DIAGNOSIS — U099 Post covid-19 condition, unspecified: Secondary | ICD-10-CM | POA: Diagnosis not present

## 2021-02-27 DIAGNOSIS — E871 Hypo-osmolality and hyponatremia: Secondary | ICD-10-CM | POA: Diagnosis not present

## 2021-02-27 DIAGNOSIS — J9690 Respiratory failure, unspecified, unspecified whether with hypoxia or hypercapnia: Secondary | ICD-10-CM | POA: Diagnosis not present

## 2021-02-27 DIAGNOSIS — J45901 Unspecified asthma with (acute) exacerbation: Secondary | ICD-10-CM | POA: Diagnosis not present

## 2021-02-27 DIAGNOSIS — I517 Cardiomegaly: Secondary | ICD-10-CM | POA: Diagnosis not present

## 2021-02-28 DIAGNOSIS — E871 Hypo-osmolality and hyponatremia: Secondary | ICD-10-CM | POA: Diagnosis not present

## 2021-02-28 DIAGNOSIS — J0100 Acute maxillary sinusitis, unspecified: Secondary | ICD-10-CM | POA: Diagnosis not present

## 2021-02-28 DIAGNOSIS — J9601 Acute respiratory failure with hypoxia: Secondary | ICD-10-CM | POA: Diagnosis not present

## 2021-03-10 DIAGNOSIS — E7800 Pure hypercholesterolemia, unspecified: Secondary | ICD-10-CM | POA: Diagnosis not present

## 2021-03-10 DIAGNOSIS — I10 Essential (primary) hypertension: Secondary | ICD-10-CM | POA: Diagnosis not present

## 2021-03-12 DIAGNOSIS — J9601 Acute respiratory failure with hypoxia: Secondary | ICD-10-CM | POA: Diagnosis not present

## 2021-03-12 DIAGNOSIS — J4541 Moderate persistent asthma with (acute) exacerbation: Secondary | ICD-10-CM | POA: Diagnosis not present

## 2021-03-25 DIAGNOSIS — G4733 Obstructive sleep apnea (adult) (pediatric): Secondary | ICD-10-CM | POA: Diagnosis not present

## 2021-03-26 DIAGNOSIS — G8929 Other chronic pain: Secondary | ICD-10-CM | POA: Diagnosis not present

## 2021-04-01 DIAGNOSIS — E559 Vitamin D deficiency, unspecified: Secondary | ICD-10-CM | POA: Diagnosis not present

## 2021-04-01 DIAGNOSIS — M79609 Pain in unspecified limb: Secondary | ICD-10-CM | POA: Diagnosis not present

## 2021-04-01 DIAGNOSIS — Z01818 Encounter for other preprocedural examination: Secondary | ICD-10-CM | POA: Diagnosis not present

## 2021-04-01 DIAGNOSIS — Z79899 Other long term (current) drug therapy: Secondary | ICD-10-CM | POA: Diagnosis not present

## 2021-05-16 DIAGNOSIS — G8929 Other chronic pain: Secondary | ICD-10-CM | POA: Diagnosis not present

## 2021-06-04 DIAGNOSIS — M19011 Primary osteoarthritis, right shoulder: Secondary | ICD-10-CM | POA: Diagnosis not present

## 2021-06-04 DIAGNOSIS — M25711 Osteophyte, right shoulder: Secondary | ICD-10-CM | POA: Diagnosis not present

## 2021-06-04 DIAGNOSIS — M25511 Pain in right shoulder: Secondary | ICD-10-CM | POA: Diagnosis not present

## 2021-06-06 DIAGNOSIS — F418 Other specified anxiety disorders: Secondary | ICD-10-CM | POA: Diagnosis not present

## 2021-06-06 DIAGNOSIS — Z6838 Body mass index (BMI) 38.0-38.9, adult: Secondary | ICD-10-CM | POA: Diagnosis not present

## 2021-06-06 DIAGNOSIS — J310 Chronic rhinitis: Secondary | ICD-10-CM | POA: Diagnosis not present

## 2021-06-06 DIAGNOSIS — E1169 Type 2 diabetes mellitus with other specified complication: Secondary | ICD-10-CM | POA: Diagnosis not present

## 2021-06-06 DIAGNOSIS — J4540 Moderate persistent asthma, uncomplicated: Secondary | ICD-10-CM | POA: Diagnosis not present

## 2021-06-06 DIAGNOSIS — I10 Essential (primary) hypertension: Secondary | ICD-10-CM | POA: Diagnosis not present

## 2021-06-06 DIAGNOSIS — E668 Other obesity: Secondary | ICD-10-CM | POA: Diagnosis not present

## 2021-06-06 DIAGNOSIS — Z79899 Other long term (current) drug therapy: Secondary | ICD-10-CM | POA: Diagnosis not present

## 2021-06-06 DIAGNOSIS — E7800 Pure hypercholesterolemia, unspecified: Secondary | ICD-10-CM | POA: Diagnosis not present

## 2021-06-10 DIAGNOSIS — Z01818 Encounter for other preprocedural examination: Secondary | ICD-10-CM | POA: Diagnosis not present

## 2021-06-10 DIAGNOSIS — M19011 Primary osteoarthritis, right shoulder: Secondary | ICD-10-CM | POA: Diagnosis not present

## 2021-06-17 DIAGNOSIS — M19011 Primary osteoarthritis, right shoulder: Secondary | ICD-10-CM | POA: Diagnosis not present

## 2021-06-26 DIAGNOSIS — M79609 Pain in unspecified limb: Secondary | ICD-10-CM | POA: Diagnosis not present

## 2021-06-26 DIAGNOSIS — Z79899 Other long term (current) drug therapy: Secondary | ICD-10-CM | POA: Diagnosis not present

## 2021-06-26 DIAGNOSIS — R52 Pain, unspecified: Secondary | ICD-10-CM | POA: Diagnosis not present

## 2021-07-04 DIAGNOSIS — Z9889 Other specified postprocedural states: Secondary | ICD-10-CM | POA: Diagnosis not present

## 2021-07-04 DIAGNOSIS — Z6834 Body mass index (BMI) 34.0-34.9, adult: Secondary | ICD-10-CM | POA: Diagnosis not present

## 2021-07-04 DIAGNOSIS — Z7984 Long term (current) use of oral hypoglycemic drugs: Secondary | ICD-10-CM | POA: Diagnosis not present

## 2021-07-04 DIAGNOSIS — E785 Hyperlipidemia, unspecified: Secondary | ICD-10-CM | POA: Diagnosis not present

## 2021-07-04 DIAGNOSIS — E119 Type 2 diabetes mellitus without complications: Secondary | ICD-10-CM | POA: Diagnosis not present

## 2021-07-04 DIAGNOSIS — E669 Obesity, unspecified: Secondary | ICD-10-CM | POA: Diagnosis not present

## 2021-07-04 DIAGNOSIS — M19011 Primary osteoarthritis, right shoulder: Secondary | ICD-10-CM | POA: Diagnosis not present

## 2021-07-04 DIAGNOSIS — G8918 Other acute postprocedural pain: Secondary | ICD-10-CM | POA: Diagnosis not present

## 2021-07-04 DIAGNOSIS — R2689 Other abnormalities of gait and mobility: Secondary | ICD-10-CM | POA: Diagnosis not present

## 2021-07-04 DIAGNOSIS — G4733 Obstructive sleep apnea (adult) (pediatric): Secondary | ICD-10-CM | POA: Diagnosis not present

## 2021-07-04 DIAGNOSIS — Z96611 Presence of right artificial shoulder joint: Secondary | ICD-10-CM | POA: Diagnosis not present

## 2021-07-04 DIAGNOSIS — J45909 Unspecified asthma, uncomplicated: Secondary | ICD-10-CM | POA: Diagnosis not present

## 2021-07-04 DIAGNOSIS — Z471 Aftercare following joint replacement surgery: Secondary | ICD-10-CM | POA: Diagnosis not present

## 2021-07-04 DIAGNOSIS — K219 Gastro-esophageal reflux disease without esophagitis: Secondary | ICD-10-CM | POA: Diagnosis not present

## 2021-07-04 DIAGNOSIS — I10 Essential (primary) hypertension: Secondary | ICD-10-CM | POA: Diagnosis not present

## 2021-07-11 DIAGNOSIS — M25511 Pain in right shoulder: Secondary | ICD-10-CM | POA: Diagnosis not present

## 2021-07-11 DIAGNOSIS — M6281 Muscle weakness (generalized): Secondary | ICD-10-CM | POA: Diagnosis not present

## 2021-07-11 DIAGNOSIS — M25611 Stiffness of right shoulder, not elsewhere classified: Secondary | ICD-10-CM | POA: Diagnosis not present

## 2021-07-15 DIAGNOSIS — M6281 Muscle weakness (generalized): Secondary | ICD-10-CM | POA: Diagnosis not present

## 2021-07-15 DIAGNOSIS — M25611 Stiffness of right shoulder, not elsewhere classified: Secondary | ICD-10-CM | POA: Diagnosis not present

## 2021-07-15 DIAGNOSIS — M25511 Pain in right shoulder: Secondary | ICD-10-CM | POA: Diagnosis not present

## 2021-07-16 DIAGNOSIS — R0602 Shortness of breath: Secondary | ICD-10-CM | POA: Diagnosis not present

## 2021-07-16 DIAGNOSIS — Z5321 Procedure and treatment not carried out due to patient leaving prior to being seen by health care provider: Secondary | ICD-10-CM | POA: Diagnosis not present

## 2021-07-17 DIAGNOSIS — R059 Cough, unspecified: Secondary | ICD-10-CM | POA: Diagnosis not present

## 2021-07-17 DIAGNOSIS — J209 Acute bronchitis, unspecified: Secondary | ICD-10-CM | POA: Diagnosis not present

## 2021-07-17 DIAGNOSIS — J9811 Atelectasis: Secondary | ICD-10-CM | POA: Diagnosis not present

## 2021-07-17 DIAGNOSIS — R0603 Acute respiratory distress: Secondary | ICD-10-CM | POA: Diagnosis not present

## 2021-07-18 DIAGNOSIS — R0603 Acute respiratory distress: Secondary | ICD-10-CM | POA: Diagnosis not present

## 2021-07-18 DIAGNOSIS — J45909 Unspecified asthma, uncomplicated: Secondary | ICD-10-CM | POA: Diagnosis not present

## 2021-07-18 DIAGNOSIS — J9811 Atelectasis: Secondary | ICD-10-CM | POA: Diagnosis not present

## 2021-07-18 DIAGNOSIS — J9801 Acute bronchospasm: Secondary | ICD-10-CM | POA: Diagnosis not present

## 2021-07-18 DIAGNOSIS — R0982 Postnasal drip: Secondary | ICD-10-CM | POA: Diagnosis not present

## 2021-07-22 DIAGNOSIS — M6281 Muscle weakness (generalized): Secondary | ICD-10-CM | POA: Diagnosis not present

## 2021-07-22 DIAGNOSIS — M25611 Stiffness of right shoulder, not elsewhere classified: Secondary | ICD-10-CM | POA: Diagnosis not present

## 2021-07-22 DIAGNOSIS — M25511 Pain in right shoulder: Secondary | ICD-10-CM | POA: Diagnosis not present

## 2021-07-25 DIAGNOSIS — M25511 Pain in right shoulder: Secondary | ICD-10-CM | POA: Diagnosis not present

## 2021-07-25 DIAGNOSIS — M6281 Muscle weakness (generalized): Secondary | ICD-10-CM | POA: Diagnosis not present

## 2021-07-25 DIAGNOSIS — M25611 Stiffness of right shoulder, not elsewhere classified: Secondary | ICD-10-CM | POA: Diagnosis not present

## 2021-07-29 DIAGNOSIS — M25611 Stiffness of right shoulder, not elsewhere classified: Secondary | ICD-10-CM | POA: Diagnosis not present

## 2021-07-29 DIAGNOSIS — M6281 Muscle weakness (generalized): Secondary | ICD-10-CM | POA: Diagnosis not present

## 2021-07-29 DIAGNOSIS — M25511 Pain in right shoulder: Secondary | ICD-10-CM | POA: Diagnosis not present

## 2021-07-31 DIAGNOSIS — R7989 Other specified abnormal findings of blood chemistry: Secondary | ICD-10-CM | POA: Diagnosis not present

## 2021-07-31 DIAGNOSIS — M25611 Stiffness of right shoulder, not elsewhere classified: Secondary | ICD-10-CM | POA: Diagnosis not present

## 2021-07-31 DIAGNOSIS — J310 Chronic rhinitis: Secondary | ICD-10-CM | POA: Diagnosis not present

## 2021-07-31 DIAGNOSIS — K219 Gastro-esophageal reflux disease without esophagitis: Secondary | ICD-10-CM

## 2021-07-31 DIAGNOSIS — G8929 Other chronic pain: Secondary | ICD-10-CM | POA: Diagnosis not present

## 2021-07-31 DIAGNOSIS — I129 Hypertensive chronic kidney disease with stage 1 through stage 4 chronic kidney disease, or unspecified chronic kidney disease: Secondary | ICD-10-CM | POA: Diagnosis not present

## 2021-07-31 DIAGNOSIS — M6281 Muscle weakness (generalized): Secondary | ICD-10-CM | POA: Diagnosis not present

## 2021-07-31 DIAGNOSIS — M25511 Pain in right shoulder: Secondary | ICD-10-CM | POA: Diagnosis not present

## 2021-07-31 DIAGNOSIS — N528 Other male erectile dysfunction: Secondary | ICD-10-CM | POA: Diagnosis not present

## 2021-07-31 DIAGNOSIS — I10 Essential (primary) hypertension: Secondary | ICD-10-CM

## 2021-07-31 DIAGNOSIS — N189 Chronic kidney disease, unspecified: Secondary | ICD-10-CM | POA: Diagnosis not present

## 2021-07-31 DIAGNOSIS — E782 Mixed hyperlipidemia: Secondary | ICD-10-CM

## 2021-07-31 DIAGNOSIS — E1122 Type 2 diabetes mellitus with diabetic chronic kidney disease: Secondary | ICD-10-CM | POA: Diagnosis not present

## 2021-07-31 DIAGNOSIS — J4530 Mild persistent asthma, uncomplicated: Secondary | ICD-10-CM

## 2021-07-31 DIAGNOSIS — E119 Type 2 diabetes mellitus without complications: Secondary | ICD-10-CM

## 2021-07-31 DIAGNOSIS — Z7984 Long term (current) use of oral hypoglycemic drugs: Secondary | ICD-10-CM | POA: Diagnosis not present

## 2021-07-31 HISTORY — DX: Gastro-esophageal reflux disease without esophagitis: K21.9

## 2021-07-31 HISTORY — DX: Mild persistent asthma, uncomplicated: J45.30

## 2021-07-31 HISTORY — DX: Essential (primary) hypertension: I10

## 2021-07-31 HISTORY — DX: Type 2 diabetes mellitus without complications: E11.9

## 2021-07-31 HISTORY — DX: Mixed hyperlipidemia: E78.2

## 2021-08-07 DIAGNOSIS — M25511 Pain in right shoulder: Secondary | ICD-10-CM | POA: Diagnosis not present

## 2021-08-07 DIAGNOSIS — M6281 Muscle weakness (generalized): Secondary | ICD-10-CM | POA: Diagnosis not present

## 2021-08-07 DIAGNOSIS — M25611 Stiffness of right shoulder, not elsewhere classified: Secondary | ICD-10-CM | POA: Diagnosis not present

## 2021-08-14 DIAGNOSIS — M25511 Pain in right shoulder: Secondary | ICD-10-CM | POA: Diagnosis not present

## 2021-08-14 DIAGNOSIS — M25611 Stiffness of right shoulder, not elsewhere classified: Secondary | ICD-10-CM | POA: Diagnosis not present

## 2021-08-14 DIAGNOSIS — M6281 Muscle weakness (generalized): Secondary | ICD-10-CM | POA: Diagnosis not present

## 2021-08-17 DIAGNOSIS — Z20822 Contact with and (suspected) exposure to covid-19: Secondary | ICD-10-CM | POA: Diagnosis not present

## 2021-08-17 DIAGNOSIS — R112 Nausea with vomiting, unspecified: Secondary | ICD-10-CM | POA: Diagnosis not present

## 2021-08-20 DIAGNOSIS — B9689 Other specified bacterial agents as the cause of diseases classified elsewhere: Secondary | ICD-10-CM | POA: Diagnosis not present

## 2021-08-20 DIAGNOSIS — K5900 Constipation, unspecified: Secondary | ICD-10-CM | POA: Diagnosis not present

## 2021-08-20 DIAGNOSIS — L03116 Cellulitis of left lower limb: Secondary | ICD-10-CM | POA: Diagnosis not present

## 2021-08-20 DIAGNOSIS — R1084 Generalized abdominal pain: Secondary | ICD-10-CM | POA: Diagnosis not present

## 2021-08-20 DIAGNOSIS — J019 Acute sinusitis, unspecified: Secondary | ICD-10-CM | POA: Diagnosis not present

## 2021-08-20 DIAGNOSIS — R109 Unspecified abdominal pain: Secondary | ICD-10-CM | POA: Diagnosis not present

## 2021-08-21 DIAGNOSIS — M25611 Stiffness of right shoulder, not elsewhere classified: Secondary | ICD-10-CM | POA: Diagnosis not present

## 2021-08-21 DIAGNOSIS — M25511 Pain in right shoulder: Secondary | ICD-10-CM | POA: Diagnosis not present

## 2021-08-21 DIAGNOSIS — M19011 Primary osteoarthritis, right shoulder: Secondary | ICD-10-CM | POA: Diagnosis not present

## 2021-08-21 DIAGNOSIS — M6281 Muscle weakness (generalized): Secondary | ICD-10-CM | POA: Diagnosis not present

## 2021-08-24 DIAGNOSIS — K625 Hemorrhage of anus and rectum: Secondary | ICD-10-CM | POA: Diagnosis not present

## 2021-08-24 DIAGNOSIS — K648 Other hemorrhoids: Secondary | ICD-10-CM | POA: Diagnosis not present

## 2021-08-24 DIAGNOSIS — K760 Fatty (change of) liver, not elsewhere classified: Secondary | ICD-10-CM | POA: Diagnosis not present

## 2021-08-24 DIAGNOSIS — M47816 Spondylosis without myelopathy or radiculopathy, lumbar region: Secondary | ICD-10-CM | POA: Diagnosis not present

## 2021-08-24 DIAGNOSIS — K449 Diaphragmatic hernia without obstruction or gangrene: Secondary | ICD-10-CM | POA: Diagnosis not present

## 2021-08-24 DIAGNOSIS — K922 Gastrointestinal hemorrhage, unspecified: Secondary | ICD-10-CM | POA: Diagnosis not present

## 2021-08-28 DIAGNOSIS — K219 Gastro-esophageal reflux disease without esophagitis: Secondary | ICD-10-CM | POA: Diagnosis not present

## 2021-08-28 DIAGNOSIS — M25511 Pain in right shoulder: Secondary | ICD-10-CM | POA: Diagnosis not present

## 2021-08-28 DIAGNOSIS — M25611 Stiffness of right shoulder, not elsewhere classified: Secondary | ICD-10-CM | POA: Diagnosis not present

## 2021-08-28 DIAGNOSIS — M6281 Muscle weakness (generalized): Secondary | ICD-10-CM | POA: Diagnosis not present

## 2021-08-28 DIAGNOSIS — K625 Hemorrhage of anus and rectum: Secondary | ICD-10-CM | POA: Diagnosis not present

## 2021-09-02 DIAGNOSIS — M25511 Pain in right shoulder: Secondary | ICD-10-CM | POA: Diagnosis not present

## 2021-09-02 DIAGNOSIS — M25611 Stiffness of right shoulder, not elsewhere classified: Secondary | ICD-10-CM | POA: Diagnosis not present

## 2021-09-02 DIAGNOSIS — M6281 Muscle weakness (generalized): Secondary | ICD-10-CM | POA: Diagnosis not present

## 2021-09-04 DIAGNOSIS — M6281 Muscle weakness (generalized): Secondary | ICD-10-CM | POA: Diagnosis not present

## 2021-09-04 DIAGNOSIS — M25611 Stiffness of right shoulder, not elsewhere classified: Secondary | ICD-10-CM | POA: Diagnosis not present

## 2021-09-04 DIAGNOSIS — M25511 Pain in right shoulder: Secondary | ICD-10-CM | POA: Diagnosis not present

## 2021-09-05 DIAGNOSIS — G8929 Other chronic pain: Secondary | ICD-10-CM | POA: Diagnosis not present

## 2021-09-11 DIAGNOSIS — M25611 Stiffness of right shoulder, not elsewhere classified: Secondary | ICD-10-CM | POA: Diagnosis not present

## 2021-09-11 DIAGNOSIS — M25511 Pain in right shoulder: Secondary | ICD-10-CM | POA: Diagnosis not present

## 2021-09-11 DIAGNOSIS — M6281 Muscle weakness (generalized): Secondary | ICD-10-CM | POA: Diagnosis not present

## 2021-09-13 DIAGNOSIS — M25611 Stiffness of right shoulder, not elsewhere classified: Secondary | ICD-10-CM | POA: Diagnosis not present

## 2021-09-13 DIAGNOSIS — M25511 Pain in right shoulder: Secondary | ICD-10-CM | POA: Diagnosis not present

## 2021-09-13 DIAGNOSIS — M6281 Muscle weakness (generalized): Secondary | ICD-10-CM | POA: Diagnosis not present

## 2021-09-17 DIAGNOSIS — J309 Allergic rhinitis, unspecified: Secondary | ICD-10-CM | POA: Diagnosis not present

## 2021-09-17 DIAGNOSIS — R051 Acute cough: Secondary | ICD-10-CM | POA: Diagnosis not present

## 2021-09-23 DIAGNOSIS — M25611 Stiffness of right shoulder, not elsewhere classified: Secondary | ICD-10-CM | POA: Diagnosis not present

## 2021-09-23 DIAGNOSIS — M25511 Pain in right shoulder: Secondary | ICD-10-CM | POA: Diagnosis not present

## 2021-09-23 DIAGNOSIS — M6281 Muscle weakness (generalized): Secondary | ICD-10-CM | POA: Diagnosis not present

## 2021-09-25 DIAGNOSIS — M6281 Muscle weakness (generalized): Secondary | ICD-10-CM | POA: Diagnosis not present

## 2021-09-25 DIAGNOSIS — M25511 Pain in right shoulder: Secondary | ICD-10-CM | POA: Diagnosis not present

## 2021-09-25 DIAGNOSIS — M25611 Stiffness of right shoulder, not elsewhere classified: Secondary | ICD-10-CM | POA: Diagnosis not present

## 2021-09-27 DIAGNOSIS — E661 Drug-induced obesity: Secondary | ICD-10-CM | POA: Diagnosis not present

## 2021-09-27 DIAGNOSIS — Z6836 Body mass index (BMI) 36.0-36.9, adult: Secondary | ICD-10-CM | POA: Diagnosis not present

## 2021-10-01 DIAGNOSIS — M25611 Stiffness of right shoulder, not elsewhere classified: Secondary | ICD-10-CM | POA: Diagnosis not present

## 2021-10-01 DIAGNOSIS — M6281 Muscle weakness (generalized): Secondary | ICD-10-CM | POA: Diagnosis not present

## 2021-10-01 DIAGNOSIS — M25511 Pain in right shoulder: Secondary | ICD-10-CM | POA: Diagnosis not present

## 2021-10-07 DIAGNOSIS — M19011 Primary osteoarthritis, right shoulder: Secondary | ICD-10-CM | POA: Diagnosis not present

## 2021-10-17 ENCOUNTER — Telehealth: Payer: Self-pay

## 2021-10-17 NOTE — Patient Outreach (Signed)
  Care Coordination   Initial Visit Note   10/17/2021 Name: Clarence Larson MRN: [id number] DOB: 06/02/1965  Clarence Larson is a 56 y.o. year old male who sees Greig Right, MD for primary care. I spoke with  Clarence Larson by phone today.  What matters to the patients health and wellness today?  Placed call to patient to explained [facility] care coordination program. Patient reports that he no longer sees Dr. Burnett Sheng and that he goes to [facility] and seeing Kathrynn Ducking.   SDOH assessments and interventions completed:       Care Coordination Interventions Activated:  No  Care Coordination Interventions:  No, not indicated   Follow up plan: No further intervention required.   Encounter Outcome:  Pt. Refused   Tomasa Rand, RN, BSN, CEN [facility] [employer] [phone]

## 2021-10-23 DIAGNOSIS — J441 Chronic obstructive pulmonary disease with (acute) exacerbation: Secondary | ICD-10-CM | POA: Diagnosis not present

## 2021-10-23 DIAGNOSIS — K219 Gastro-esophageal reflux disease without esophagitis: Secondary | ICD-10-CM | POA: Diagnosis not present

## 2021-10-23 DIAGNOSIS — R112 Nausea with vomiting, unspecified: Secondary | ICD-10-CM | POA: Diagnosis not present

## 2021-10-23 DIAGNOSIS — R0603 Acute respiratory distress: Secondary | ICD-10-CM | POA: Diagnosis not present

## 2021-10-23 DIAGNOSIS — J9601 Acute respiratory failure with hypoxia: Secondary | ICD-10-CM | POA: Diagnosis not present

## 2021-10-23 DIAGNOSIS — R079 Chest pain, unspecified: Secondary | ICD-10-CM | POA: Diagnosis not present

## 2021-10-23 DIAGNOSIS — F419 Anxiety disorder, unspecified: Secondary | ICD-10-CM | POA: Diagnosis not present

## 2021-10-23 DIAGNOSIS — Z8701 Personal history of pneumonia (recurrent): Secondary | ICD-10-CM | POA: Diagnosis not present

## 2021-10-24 DIAGNOSIS — J9601 Acute respiratory failure with hypoxia: Secondary | ICD-10-CM | POA: Diagnosis not present

## 2021-10-24 DIAGNOSIS — R112 Nausea with vomiting, unspecified: Secondary | ICD-10-CM | POA: Diagnosis not present

## 2021-10-24 DIAGNOSIS — R0603 Acute respiratory distress: Secondary | ICD-10-CM | POA: Diagnosis not present

## 2021-10-24 DIAGNOSIS — K219 Gastro-esophageal reflux disease without esophagitis: Secondary | ICD-10-CM | POA: Diagnosis not present

## 2021-10-24 DIAGNOSIS — R079 Chest pain, unspecified: Secondary | ICD-10-CM | POA: Diagnosis not present

## 2021-11-02 DIAGNOSIS — R052 Subacute cough: Secondary | ICD-10-CM | POA: Diagnosis not present

## 2021-11-02 DIAGNOSIS — R0602 Shortness of breath: Secondary | ICD-10-CM | POA: Diagnosis not present

## 2021-11-02 DIAGNOSIS — J209 Acute bronchitis, unspecified: Secondary | ICD-10-CM | POA: Diagnosis not present

## 2021-11-06 DIAGNOSIS — R1110 Vomiting, unspecified: Secondary | ICD-10-CM | POA: Diagnosis not present

## 2021-11-06 DIAGNOSIS — R109 Unspecified abdominal pain: Secondary | ICD-10-CM | POA: Diagnosis not present

## 2021-11-06 DIAGNOSIS — K529 Noninfective gastroenteritis and colitis, unspecified: Secondary | ICD-10-CM | POA: Diagnosis not present

## 2021-11-06 DIAGNOSIS — K5732 Diverticulitis of large intestine without perforation or abscess without bleeding: Secondary | ICD-10-CM | POA: Diagnosis not present

## 2021-11-06 DIAGNOSIS — K760 Fatty (change of) liver, not elsewhere classified: Secondary | ICD-10-CM | POA: Diagnosis not present

## 2021-11-06 DIAGNOSIS — J449 Chronic obstructive pulmonary disease, unspecified: Secondary | ICD-10-CM | POA: Diagnosis not present

## 2021-11-06 DIAGNOSIS — K5792 Diverticulitis of intestine, part unspecified, without perforation or abscess without bleeding: Secondary | ICD-10-CM | POA: Diagnosis not present

## 2021-11-09 DIAGNOSIS — Z6836 Body mass index (BMI) 36.0-36.9, adult: Secondary | ICD-10-CM | POA: Diagnosis not present

## 2021-11-09 DIAGNOSIS — Z885 Allergy status to narcotic agent status: Secondary | ICD-10-CM | POA: Diagnosis not present

## 2021-11-09 DIAGNOSIS — R059 Cough, unspecified: Secondary | ICD-10-CM | POA: Diagnosis not present

## 2021-11-09 DIAGNOSIS — I10 Essential (primary) hypertension: Secondary | ICD-10-CM | POA: Diagnosis not present

## 2021-11-09 DIAGNOSIS — Z79899 Other long term (current) drug therapy: Secondary | ICD-10-CM | POA: Diagnosis not present

## 2021-11-09 DIAGNOSIS — Z7984 Long term (current) use of oral hypoglycemic drugs: Secondary | ICD-10-CM | POA: Diagnosis not present

## 2021-11-09 DIAGNOSIS — J45901 Unspecified asthma with (acute) exacerbation: Secondary | ICD-10-CM | POA: Diagnosis not present

## 2021-11-09 DIAGNOSIS — E785 Hyperlipidemia, unspecified: Secondary | ICD-10-CM | POA: Diagnosis not present

## 2021-11-09 DIAGNOSIS — Z888 Allergy status to other drugs, medicaments and biological substances status: Secondary | ICD-10-CM | POA: Diagnosis not present

## 2021-11-09 DIAGNOSIS — K219 Gastro-esophageal reflux disease without esophagitis: Secondary | ICD-10-CM | POA: Diagnosis not present

## 2021-11-09 DIAGNOSIS — J069 Acute upper respiratory infection, unspecified: Secondary | ICD-10-CM | POA: Diagnosis not present

## 2021-11-13 DIAGNOSIS — N528 Other male erectile dysfunction: Secondary | ICD-10-CM | POA: Diagnosis not present

## 2021-11-13 DIAGNOSIS — I10 Essential (primary) hypertension: Secondary | ICD-10-CM | POA: Diagnosis not present

## 2021-11-14 ENCOUNTER — Encounter (HOSPITAL_COMMUNITY): Payer: Self-pay

## 2021-11-14 DIAGNOSIS — G894 Chronic pain syndrome: Secondary | ICD-10-CM | POA: Diagnosis not present

## 2021-11-14 DIAGNOSIS — D649 Anemia, unspecified: Secondary | ICD-10-CM | POA: Diagnosis not present

## 2021-11-14 DIAGNOSIS — M1990 Unspecified osteoarthritis, unspecified site: Secondary | ICD-10-CM | POA: Diagnosis not present

## 2021-11-14 DIAGNOSIS — Z8701 Personal history of pneumonia (recurrent): Secondary | ICD-10-CM | POA: Diagnosis not present

## 2021-11-14 DIAGNOSIS — R7303 Prediabetes: Secondary | ICD-10-CM | POA: Diagnosis not present

## 2021-11-14 DIAGNOSIS — Z79899 Other long term (current) drug therapy: Secondary | ICD-10-CM | POA: Diagnosis not present

## 2021-11-14 DIAGNOSIS — M5021 Other cervical disc displacement,  high cervical region: Secondary | ICD-10-CM | POA: Diagnosis not present

## 2021-11-14 DIAGNOSIS — I639 Cerebral infarction, unspecified: Secondary | ICD-10-CM | POA: Diagnosis not present

## 2021-11-14 DIAGNOSIS — I6523 Occlusion and stenosis of bilateral carotid arteries: Secondary | ICD-10-CM | POA: Diagnosis not present

## 2021-11-14 DIAGNOSIS — J449 Chronic obstructive pulmonary disease, unspecified: Secondary | ICD-10-CM | POA: Diagnosis not present

## 2021-11-14 DIAGNOSIS — Z7902 Long term (current) use of antithrombotics/antiplatelets: Secondary | ICD-10-CM | POA: Diagnosis not present

## 2021-11-14 DIAGNOSIS — R2990 Unspecified symptoms and signs involving the nervous system: Secondary | ICD-10-CM | POA: Diagnosis not present

## 2021-11-14 DIAGNOSIS — E7800 Pure hypercholesterolemia, unspecified: Secondary | ICD-10-CM | POA: Diagnosis not present

## 2021-11-14 DIAGNOSIS — R29701 NIHSS score 1: Secondary | ICD-10-CM | POA: Diagnosis not present

## 2021-11-14 DIAGNOSIS — Z7982 Long term (current) use of aspirin: Secondary | ICD-10-CM | POA: Diagnosis not present

## 2021-11-14 DIAGNOSIS — Z885 Allergy status to narcotic agent status: Secondary | ICD-10-CM | POA: Diagnosis not present

## 2021-11-14 DIAGNOSIS — M4802 Spinal stenosis, cervical region: Secondary | ICD-10-CM | POA: Diagnosis not present

## 2021-11-14 DIAGNOSIS — F419 Anxiety disorder, unspecified: Secondary | ICD-10-CM | POA: Diagnosis not present

## 2021-11-14 DIAGNOSIS — E039 Hypothyroidism, unspecified: Secondary | ICD-10-CM | POA: Diagnosis not present

## 2021-11-14 DIAGNOSIS — I10 Essential (primary) hypertension: Secondary | ICD-10-CM | POA: Diagnosis not present

## 2021-11-14 DIAGNOSIS — G8194 Hemiplegia, unspecified affecting left nondominant side: Secondary | ICD-10-CM | POA: Diagnosis not present

## 2021-11-14 DIAGNOSIS — R200 Anesthesia of skin: Secondary | ICD-10-CM | POA: Diagnosis not present

## 2021-11-14 DIAGNOSIS — Z7984 Long term (current) use of oral hypoglycemic drugs: Secondary | ICD-10-CM | POA: Diagnosis not present

## 2021-11-14 DIAGNOSIS — Z888 Allergy status to other drugs, medicaments and biological substances status: Secondary | ICD-10-CM | POA: Diagnosis not present

## 2021-11-22 DIAGNOSIS — I6521 Occlusion and stenosis of right carotid artery: Secondary | ICD-10-CM | POA: Diagnosis not present

## 2021-11-22 DIAGNOSIS — G894 Chronic pain syndrome: Secondary | ICD-10-CM | POA: Insufficient documentation

## 2021-11-22 DIAGNOSIS — I129 Hypertensive chronic kidney disease with stage 1 through stage 4 chronic kidney disease, or unspecified chronic kidney disease: Secondary | ICD-10-CM | POA: Diagnosis not present

## 2021-11-22 DIAGNOSIS — E1122 Type 2 diabetes mellitus with diabetic chronic kidney disease: Secondary | ICD-10-CM | POA: Diagnosis not present

## 2021-11-22 DIAGNOSIS — N183 Chronic kidney disease, stage 3 unspecified: Secondary | ICD-10-CM | POA: Diagnosis not present

## 2021-11-22 HISTORY — DX: Chronic pain syndrome: G89.4

## 2021-11-29 DIAGNOSIS — D649 Anemia, unspecified: Secondary | ICD-10-CM | POA: Diagnosis not present

## 2021-11-29 DIAGNOSIS — Z885 Allergy status to narcotic agent status: Secondary | ICD-10-CM | POA: Diagnosis not present

## 2021-11-29 DIAGNOSIS — Z79891 Long term (current) use of opiate analgesic: Secondary | ICD-10-CM | POA: Diagnosis not present

## 2021-11-29 DIAGNOSIS — Z79899 Other long term (current) drug therapy: Secondary | ICD-10-CM | POA: Diagnosis not present

## 2021-11-29 DIAGNOSIS — Z8673 Personal history of transient ischemic attack (TIA), and cerebral infarction without residual deficits: Secondary | ICD-10-CM | POA: Diagnosis not present

## 2021-11-29 DIAGNOSIS — I6521 Occlusion and stenosis of right carotid artery: Secondary | ICD-10-CM | POA: Diagnosis not present

## 2021-11-29 DIAGNOSIS — E039 Hypothyroidism, unspecified: Secondary | ICD-10-CM | POA: Diagnosis not present

## 2021-11-29 DIAGNOSIS — E119 Type 2 diabetes mellitus without complications: Secondary | ICD-10-CM | POA: Diagnosis not present

## 2021-11-29 DIAGNOSIS — Z8701 Personal history of pneumonia (recurrent): Secondary | ICD-10-CM | POA: Diagnosis not present

## 2021-11-29 DIAGNOSIS — I517 Cardiomegaly: Secondary | ICD-10-CM | POA: Diagnosis not present

## 2021-11-29 DIAGNOSIS — I6602 Occlusion and stenosis of left middle cerebral artery: Secondary | ICD-10-CM | POA: Diagnosis not present

## 2021-11-29 DIAGNOSIS — E7800 Pure hypercholesterolemia, unspecified: Secondary | ICD-10-CM | POA: Diagnosis not present

## 2021-11-29 DIAGNOSIS — M1990 Unspecified osteoarthritis, unspecified site: Secondary | ICD-10-CM | POA: Diagnosis not present

## 2021-11-29 DIAGNOSIS — R9431 Abnormal electrocardiogram [ECG] [EKG]: Secondary | ICD-10-CM | POA: Diagnosis not present

## 2021-11-29 DIAGNOSIS — G894 Chronic pain syndrome: Secondary | ICD-10-CM | POA: Diagnosis not present

## 2021-11-29 DIAGNOSIS — M6282 Rhabdomyolysis: Secondary | ICD-10-CM | POA: Diagnosis not present

## 2021-11-29 DIAGNOSIS — I10 Essential (primary) hypertension: Secondary | ICD-10-CM | POA: Diagnosis not present

## 2021-11-29 DIAGNOSIS — I6623 Occlusion and stenosis of bilateral posterior cerebral arteries: Secondary | ICD-10-CM | POA: Diagnosis not present

## 2021-11-29 DIAGNOSIS — Z7984 Long term (current) use of oral hypoglycemic drugs: Secondary | ICD-10-CM | POA: Diagnosis not present

## 2021-11-29 DIAGNOSIS — Z7982 Long term (current) use of aspirin: Secondary | ICD-10-CM | POA: Diagnosis not present

## 2021-11-29 DIAGNOSIS — I6523 Occlusion and stenosis of bilateral carotid arteries: Secondary | ICD-10-CM | POA: Diagnosis not present

## 2021-11-29 DIAGNOSIS — R200 Anesthesia of skin: Secondary | ICD-10-CM | POA: Diagnosis not present

## 2021-11-29 DIAGNOSIS — R109 Unspecified abdominal pain: Secondary | ICD-10-CM | POA: Diagnosis not present

## 2021-11-29 DIAGNOSIS — Z7902 Long term (current) use of antithrombotics/antiplatelets: Secondary | ICD-10-CM | POA: Diagnosis not present

## 2021-11-29 DIAGNOSIS — R202 Paresthesia of skin: Secondary | ICD-10-CM | POA: Diagnosis not present

## 2021-11-29 DIAGNOSIS — J449 Chronic obstructive pulmonary disease, unspecified: Secondary | ICD-10-CM | POA: Diagnosis not present

## 2021-12-03 DIAGNOSIS — G8929 Other chronic pain: Secondary | ICD-10-CM | POA: Diagnosis not present

## 2021-12-10 DIAGNOSIS — R0981 Nasal congestion: Secondary | ICD-10-CM | POA: Diagnosis not present

## 2021-12-10 DIAGNOSIS — J069 Acute upper respiratory infection, unspecified: Secondary | ICD-10-CM | POA: Diagnosis not present

## 2021-12-10 DIAGNOSIS — R051 Acute cough: Secondary | ICD-10-CM | POA: Diagnosis not present

## 2021-12-18 DIAGNOSIS — R0981 Nasal congestion: Secondary | ICD-10-CM | POA: Diagnosis not present

## 2021-12-18 DIAGNOSIS — R051 Acute cough: Secondary | ICD-10-CM | POA: Diagnosis not present

## 2021-12-23 DIAGNOSIS — K5900 Constipation, unspecified: Secondary | ICD-10-CM | POA: Diagnosis not present

## 2021-12-23 DIAGNOSIS — K649 Unspecified hemorrhoids: Secondary | ICD-10-CM | POA: Diagnosis not present

## 2021-12-23 DIAGNOSIS — K5790 Diverticulosis of intestine, part unspecified, without perforation or abscess without bleeding: Secondary | ICD-10-CM | POA: Diagnosis not present

## 2021-12-23 DIAGNOSIS — K219 Gastro-esophageal reflux disease without esophagitis: Secondary | ICD-10-CM | POA: Diagnosis not present

## 2021-12-31 DIAGNOSIS — J069 Acute upper respiratory infection, unspecified: Secondary | ICD-10-CM | POA: Diagnosis not present

## 2022-01-15 DIAGNOSIS — R051 Acute cough: Secondary | ICD-10-CM | POA: Diagnosis not present

## 2022-01-15 DIAGNOSIS — J45909 Unspecified asthma, uncomplicated: Secondary | ICD-10-CM | POA: Diagnosis not present

## 2022-01-15 DIAGNOSIS — R062 Wheezing: Secondary | ICD-10-CM | POA: Diagnosis not present

## 2022-01-17 DIAGNOSIS — E662 Morbid (severe) obesity with alveolar hypoventilation: Secondary | ICD-10-CM | POA: Diagnosis not present

## 2022-01-17 DIAGNOSIS — Z5321 Procedure and treatment not carried out due to patient leaving prior to being seen by health care provider: Secondary | ICD-10-CM | POA: Diagnosis not present

## 2022-01-17 DIAGNOSIS — R0689 Other abnormalities of breathing: Secondary | ICD-10-CM | POA: Diagnosis not present

## 2022-01-17 DIAGNOSIS — R0602 Shortness of breath: Secondary | ICD-10-CM | POA: Diagnosis not present

## 2022-01-22 DIAGNOSIS — G459 Transient cerebral ischemic attack, unspecified: Secondary | ICD-10-CM | POA: Diagnosis not present

## 2022-01-22 DIAGNOSIS — R0602 Shortness of breath: Secondary | ICD-10-CM | POA: Diagnosis not present

## 2022-01-22 DIAGNOSIS — Z6838 Body mass index (BMI) 38.0-38.9, adult: Secondary | ICD-10-CM | POA: Diagnosis not present

## 2022-01-22 DIAGNOSIS — J45901 Unspecified asthma with (acute) exacerbation: Secondary | ICD-10-CM | POA: Diagnosis not present

## 2022-01-22 DIAGNOSIS — I10 Essential (primary) hypertension: Secondary | ICD-10-CM | POA: Diagnosis not present

## 2022-01-22 DIAGNOSIS — E782 Mixed hyperlipidemia: Secondary | ICD-10-CM | POA: Diagnosis not present

## 2022-01-22 DIAGNOSIS — J4530 Mild persistent asthma, uncomplicated: Secondary | ICD-10-CM | POA: Diagnosis not present

## 2022-01-22 DIAGNOSIS — F419 Anxiety disorder, unspecified: Secondary | ICD-10-CM

## 2022-01-22 DIAGNOSIS — Z20822 Contact with and (suspected) exposure to covid-19: Secondary | ICD-10-CM | POA: Diagnosis not present

## 2022-01-22 DIAGNOSIS — I6523 Occlusion and stenosis of bilateral carotid arteries: Secondary | ICD-10-CM

## 2022-01-22 DIAGNOSIS — R059 Cough, unspecified: Secondary | ICD-10-CM | POA: Diagnosis not present

## 2022-01-22 DIAGNOSIS — E119 Type 2 diabetes mellitus without complications: Secondary | ICD-10-CM | POA: Diagnosis not present

## 2022-01-22 DIAGNOSIS — R748 Abnormal levels of other serum enzymes: Secondary | ICD-10-CM

## 2022-01-22 DIAGNOSIS — D649 Anemia, unspecified: Secondary | ICD-10-CM | POA: Diagnosis not present

## 2022-01-22 DIAGNOSIS — Z9889 Other specified postprocedural states: Secondary | ICD-10-CM | POA: Diagnosis not present

## 2022-01-22 HISTORY — DX: Abnormal levels of other serum enzymes: R74.8

## 2022-01-22 HISTORY — DX: Anxiety disorder, unspecified: F41.9

## 2022-01-22 HISTORY — DX: Occlusion and stenosis of bilateral carotid arteries: I65.23

## 2022-01-29 DIAGNOSIS — J45909 Unspecified asthma, uncomplicated: Secondary | ICD-10-CM | POA: Diagnosis not present

## 2022-01-29 DIAGNOSIS — I639 Cerebral infarction, unspecified: Secondary | ICD-10-CM | POA: Diagnosis not present

## 2022-01-29 DIAGNOSIS — J45901 Unspecified asthma with (acute) exacerbation: Secondary | ICD-10-CM | POA: Diagnosis not present

## 2022-01-29 DIAGNOSIS — R0602 Shortness of breath: Secondary | ICD-10-CM | POA: Diagnosis not present

## 2022-01-29 DIAGNOSIS — F419 Anxiety disorder, unspecified: Secondary | ICD-10-CM | POA: Diagnosis not present

## 2022-01-29 DIAGNOSIS — I10 Essential (primary) hypertension: Secondary | ICD-10-CM | POA: Diagnosis not present

## 2022-01-30 DIAGNOSIS — E119 Type 2 diabetes mellitus without complications: Secondary | ICD-10-CM | POA: Diagnosis not present

## 2022-01-30 DIAGNOSIS — R5381 Other malaise: Secondary | ICD-10-CM | POA: Diagnosis not present

## 2022-01-30 DIAGNOSIS — G894 Chronic pain syndrome: Secondary | ICD-10-CM | POA: Diagnosis not present

## 2022-01-30 DIAGNOSIS — R748 Abnormal levels of other serum enzymes: Secondary | ICD-10-CM | POA: Diagnosis not present

## 2022-01-30 DIAGNOSIS — D649 Anemia, unspecified: Secondary | ICD-10-CM | POA: Diagnosis not present

## 2022-01-30 DIAGNOSIS — E291 Testicular hypofunction: Secondary | ICD-10-CM | POA: Diagnosis not present

## 2022-01-30 DIAGNOSIS — K219 Gastro-esophageal reflux disease without esophagitis: Secondary | ICD-10-CM | POA: Diagnosis not present

## 2022-01-30 DIAGNOSIS — R252 Cramp and spasm: Secondary | ICD-10-CM | POA: Diagnosis not present

## 2022-01-30 DIAGNOSIS — I10 Essential (primary) hypertension: Secondary | ICD-10-CM | POA: Diagnosis not present

## 2022-01-30 DIAGNOSIS — J4530 Mild persistent asthma, uncomplicated: Secondary | ICD-10-CM | POA: Diagnosis not present

## 2022-01-30 DIAGNOSIS — N528 Other male erectile dysfunction: Secondary | ICD-10-CM | POA: Diagnosis not present

## 2022-02-01 DIAGNOSIS — Z5321 Procedure and treatment not carried out due to patient leaving prior to being seen by health care provider: Secondary | ICD-10-CM | POA: Diagnosis not present

## 2022-02-01 DIAGNOSIS — R0609 Other forms of dyspnea: Secondary | ICD-10-CM | POA: Diagnosis not present

## 2022-02-02 DIAGNOSIS — I498 Other specified cardiac arrhythmias: Secondary | ICD-10-CM | POA: Diagnosis not present

## 2022-02-02 DIAGNOSIS — Z20822 Contact with and (suspected) exposure to covid-19: Secondary | ICD-10-CM | POA: Diagnosis not present

## 2022-02-02 DIAGNOSIS — I3139 Other pericardial effusion (noninflammatory): Secondary | ICD-10-CM | POA: Diagnosis not present

## 2022-02-02 DIAGNOSIS — E119 Type 2 diabetes mellitus without complications: Secondary | ICD-10-CM | POA: Diagnosis not present

## 2022-02-02 DIAGNOSIS — I509 Heart failure, unspecified: Secondary | ICD-10-CM | POA: Diagnosis not present

## 2022-02-02 DIAGNOSIS — I5022 Chronic systolic (congestive) heart failure: Secondary | ICD-10-CM | POA: Diagnosis not present

## 2022-02-02 DIAGNOSIS — Z1152 Encounter for screening for COVID-19: Secondary | ICD-10-CM | POA: Diagnosis not present

## 2022-02-02 DIAGNOSIS — R0600 Dyspnea, unspecified: Secondary | ICD-10-CM | POA: Diagnosis not present

## 2022-02-02 DIAGNOSIS — I371 Nonrheumatic pulmonary valve insufficiency: Secondary | ICD-10-CM | POA: Diagnosis not present

## 2022-02-02 DIAGNOSIS — I071 Rheumatic tricuspid insufficiency: Secondary | ICD-10-CM | POA: Diagnosis not present

## 2022-02-02 DIAGNOSIS — I10 Essential (primary) hypertension: Secondary | ICD-10-CM | POA: Diagnosis not present

## 2022-02-02 DIAGNOSIS — Z7952 Long term (current) use of systemic steroids: Secondary | ICD-10-CM | POA: Diagnosis not present

## 2022-02-02 DIAGNOSIS — K449 Diaphragmatic hernia without obstruction or gangrene: Secondary | ICD-10-CM | POA: Diagnosis not present

## 2022-02-02 DIAGNOSIS — I110 Hypertensive heart disease with heart failure: Secondary | ICD-10-CM | POA: Diagnosis not present

## 2022-02-02 DIAGNOSIS — R0602 Shortness of breath: Secondary | ICD-10-CM | POA: Diagnosis not present

## 2022-02-02 DIAGNOSIS — Z7984 Long term (current) use of oral hypoglycemic drugs: Secondary | ICD-10-CM | POA: Diagnosis not present

## 2022-02-02 DIAGNOSIS — K760 Fatty (change of) liver, not elsewhere classified: Secondary | ICD-10-CM | POA: Diagnosis not present

## 2022-02-02 DIAGNOSIS — Z825 Family history of asthma and other chronic lower respiratory diseases: Secondary | ICD-10-CM | POA: Diagnosis not present

## 2022-02-02 DIAGNOSIS — J309 Allergic rhinitis, unspecified: Secondary | ICD-10-CM | POA: Diagnosis not present

## 2022-02-02 DIAGNOSIS — Z6837 Body mass index (BMI) 37.0-37.9, adult: Secondary | ICD-10-CM | POA: Diagnosis not present

## 2022-02-02 DIAGNOSIS — Z79899 Other long term (current) drug therapy: Secondary | ICD-10-CM | POA: Diagnosis not present

## 2022-02-02 DIAGNOSIS — J45909 Unspecified asthma, uncomplicated: Secondary | ICD-10-CM | POA: Diagnosis not present

## 2022-02-02 DIAGNOSIS — J9601 Acute respiratory failure with hypoxia: Secondary | ICD-10-CM | POA: Diagnosis not present

## 2022-02-03 DIAGNOSIS — R000 Tachycardia, unspecified: Secondary | ICD-10-CM | POA: Diagnosis not present

## 2022-02-03 DIAGNOSIS — R0602 Shortness of breath: Secondary | ICD-10-CM | POA: Diagnosis not present

## 2022-02-03 DIAGNOSIS — Z825 Family history of asthma and other chronic lower respiratory diseases: Secondary | ICD-10-CM | POA: Diagnosis not present

## 2022-02-03 DIAGNOSIS — I10 Essential (primary) hypertension: Secondary | ICD-10-CM | POA: Diagnosis not present

## 2022-02-03 DIAGNOSIS — J9601 Acute respiratory failure with hypoxia: Secondary | ICD-10-CM | POA: Diagnosis not present

## 2022-02-03 DIAGNOSIS — E119 Type 2 diabetes mellitus without complications: Secondary | ICD-10-CM | POA: Diagnosis not present

## 2022-02-04 DIAGNOSIS — R0902 Hypoxemia: Secondary | ICD-10-CM | POA: Diagnosis not present

## 2022-02-04 DIAGNOSIS — R0602 Shortness of breath: Secondary | ICD-10-CM | POA: Diagnosis not present

## 2022-02-04 DIAGNOSIS — I509 Heart failure, unspecified: Secondary | ICD-10-CM | POA: Diagnosis not present

## 2022-02-04 DIAGNOSIS — I110 Hypertensive heart disease with heart failure: Secondary | ICD-10-CM | POA: Diagnosis not present

## 2022-02-04 DIAGNOSIS — J9601 Acute respiratory failure with hypoxia: Secondary | ICD-10-CM | POA: Diagnosis not present

## 2022-02-05 DIAGNOSIS — R0602 Shortness of breath: Secondary | ICD-10-CM | POA: Diagnosis not present

## 2022-02-10 DIAGNOSIS — R051 Acute cough: Secondary | ICD-10-CM | POA: Diagnosis not present

## 2022-02-10 DIAGNOSIS — J45909 Unspecified asthma, uncomplicated: Secondary | ICD-10-CM | POA: Diagnosis not present

## 2022-02-13 DIAGNOSIS — Z7982 Long term (current) use of aspirin: Secondary | ICD-10-CM | POA: Diagnosis not present

## 2022-02-13 DIAGNOSIS — R0600 Dyspnea, unspecified: Secondary | ICD-10-CM | POA: Diagnosis not present

## 2022-02-13 DIAGNOSIS — F419 Anxiety disorder, unspecified: Secondary | ICD-10-CM | POA: Diagnosis not present

## 2022-02-13 DIAGNOSIS — Z79899 Other long term (current) drug therapy: Secondary | ICD-10-CM | POA: Diagnosis not present

## 2022-02-13 DIAGNOSIS — G894 Chronic pain syndrome: Secondary | ICD-10-CM | POA: Diagnosis not present

## 2022-02-13 DIAGNOSIS — E119 Type 2 diabetes mellitus without complications: Secondary | ICD-10-CM | POA: Diagnosis not present

## 2022-02-13 DIAGNOSIS — J449 Chronic obstructive pulmonary disease, unspecified: Secondary | ICD-10-CM | POA: Diagnosis not present

## 2022-02-13 DIAGNOSIS — R0602 Shortness of breath: Secondary | ICD-10-CM | POA: Diagnosis not present

## 2022-02-13 DIAGNOSIS — E669 Obesity, unspecified: Secondary | ICD-10-CM | POA: Diagnosis not present

## 2022-02-13 DIAGNOSIS — G459 Transient cerebral ischemic attack, unspecified: Secondary | ICD-10-CM | POA: Diagnosis not present

## 2022-02-13 DIAGNOSIS — Z7902 Long term (current) use of antithrombotics/antiplatelets: Secondary | ICD-10-CM | POA: Diagnosis not present

## 2022-02-13 DIAGNOSIS — I161 Hypertensive emergency: Secondary | ICD-10-CM | POA: Diagnosis not present

## 2022-02-13 DIAGNOSIS — Z885 Allergy status to narcotic agent status: Secondary | ICD-10-CM | POA: Diagnosis not present

## 2022-02-13 DIAGNOSIS — I10 Essential (primary) hypertension: Secondary | ICD-10-CM | POA: Diagnosis not present

## 2022-02-13 DIAGNOSIS — R410 Disorientation, unspecified: Secondary | ICD-10-CM | POA: Diagnosis not present

## 2022-02-13 DIAGNOSIS — E7800 Pure hypercholesterolemia, unspecified: Secondary | ICD-10-CM | POA: Diagnosis not present

## 2022-02-13 DIAGNOSIS — M1990 Unspecified osteoarthritis, unspecified site: Secondary | ICD-10-CM | POA: Diagnosis not present

## 2022-02-13 DIAGNOSIS — R7401 Elevation of levels of liver transaminase levels: Secondary | ICD-10-CM | POA: Diagnosis not present

## 2022-02-13 DIAGNOSIS — Z7984 Long term (current) use of oral hypoglycemic drugs: Secondary | ICD-10-CM | POA: Diagnosis not present

## 2022-02-14 DIAGNOSIS — R7401 Elevation of levels of liver transaminase levels: Secondary | ICD-10-CM | POA: Diagnosis not present

## 2022-02-14 DIAGNOSIS — G459 Transient cerebral ischemic attack, unspecified: Secondary | ICD-10-CM | POA: Diagnosis not present

## 2022-02-14 DIAGNOSIS — E669 Obesity, unspecified: Secondary | ICD-10-CM | POA: Diagnosis not present

## 2022-02-19 DIAGNOSIS — I2510 Atherosclerotic heart disease of native coronary artery without angina pectoris: Secondary | ICD-10-CM | POA: Insufficient documentation

## 2022-02-19 HISTORY — DX: Atherosclerotic heart disease of native coronary artery without angina pectoris: I25.10

## 2022-02-25 DIAGNOSIS — G8929 Other chronic pain: Secondary | ICD-10-CM | POA: Diagnosis not present

## 2022-03-07 DIAGNOSIS — J209 Acute bronchitis, unspecified: Secondary | ICD-10-CM | POA: Diagnosis not present

## 2022-03-07 DIAGNOSIS — R062 Wheezing: Secondary | ICD-10-CM | POA: Diagnosis not present

## 2022-03-07 DIAGNOSIS — R051 Acute cough: Secondary | ICD-10-CM | POA: Diagnosis not present

## 2022-03-11 DIAGNOSIS — J4530 Mild persistent asthma, uncomplicated: Secondary | ICD-10-CM | POA: Diagnosis not present

## 2022-03-18 DIAGNOSIS — J069 Acute upper respiratory infection, unspecified: Secondary | ICD-10-CM | POA: Diagnosis not present

## 2022-03-18 DIAGNOSIS — R062 Wheezing: Secondary | ICD-10-CM | POA: Diagnosis not present

## 2022-03-18 DIAGNOSIS — R0981 Nasal congestion: Secondary | ICD-10-CM | POA: Diagnosis not present

## 2022-05-02 DIAGNOSIS — R519 Headache, unspecified: Secondary | ICD-10-CM | POA: Diagnosis not present

## 2022-05-02 DIAGNOSIS — I10 Essential (primary) hypertension: Secondary | ICD-10-CM | POA: Diagnosis not present

## 2022-05-02 DIAGNOSIS — Z7902 Long term (current) use of antithrombotics/antiplatelets: Secondary | ICD-10-CM | POA: Diagnosis not present

## 2022-05-02 DIAGNOSIS — Z8673 Personal history of transient ischemic attack (TIA), and cerebral infarction without residual deficits: Secondary | ICD-10-CM | POA: Diagnosis not present

## 2022-05-20 DIAGNOSIS — R0981 Nasal congestion: Secondary | ICD-10-CM | POA: Diagnosis not present

## 2022-05-20 DIAGNOSIS — J45909 Unspecified asthma, uncomplicated: Secondary | ICD-10-CM | POA: Diagnosis not present

## 2022-05-20 DIAGNOSIS — R051 Acute cough: Secondary | ICD-10-CM | POA: Diagnosis not present

## 2022-05-20 DIAGNOSIS — R062 Wheezing: Secondary | ICD-10-CM | POA: Diagnosis not present

## 2022-05-20 DIAGNOSIS — G8929 Other chronic pain: Secondary | ICD-10-CM | POA: Diagnosis not present

## 2022-05-20 DIAGNOSIS — R0600 Dyspnea, unspecified: Secondary | ICD-10-CM | POA: Diagnosis not present

## 2022-05-20 DIAGNOSIS — J302 Other seasonal allergic rhinitis: Secondary | ICD-10-CM | POA: Diagnosis not present

## 2022-06-11 DIAGNOSIS — R059 Cough, unspecified: Secondary | ICD-10-CM | POA: Diagnosis not present

## 2022-06-11 DIAGNOSIS — Z20822 Contact with and (suspected) exposure to covid-19: Secondary | ICD-10-CM | POA: Diagnosis not present

## 2022-06-11 DIAGNOSIS — Z5321 Procedure and treatment not carried out due to patient leaving prior to being seen by health care provider: Secondary | ICD-10-CM | POA: Diagnosis not present

## 2022-06-11 DIAGNOSIS — R0602 Shortness of breath: Secondary | ICD-10-CM | POA: Diagnosis not present

## 2022-06-14 DIAGNOSIS — R0981 Nasal congestion: Secondary | ICD-10-CM | POA: Diagnosis not present

## 2022-06-14 DIAGNOSIS — R059 Cough, unspecified: Secondary | ICD-10-CM | POA: Diagnosis not present

## 2022-06-15 DIAGNOSIS — R400 Somnolence: Secondary | ICD-10-CM | POA: Diagnosis not present

## 2022-06-15 DIAGNOSIS — I10 Essential (primary) hypertension: Secondary | ICD-10-CM | POA: Diagnosis not present

## 2022-06-15 DIAGNOSIS — R5383 Other fatigue: Secondary | ICD-10-CM | POA: Diagnosis not present

## 2022-06-21 DIAGNOSIS — J189 Pneumonia, unspecified organism: Secondary | ICD-10-CM | POA: Diagnosis not present

## 2022-06-21 DIAGNOSIS — J45909 Unspecified asthma, uncomplicated: Secondary | ICD-10-CM | POA: Diagnosis not present

## 2022-06-21 DIAGNOSIS — R062 Wheezing: Secondary | ICD-10-CM | POA: Diagnosis not present

## 2022-06-21 DIAGNOSIS — R0981 Nasal congestion: Secondary | ICD-10-CM | POA: Diagnosis not present

## 2022-06-23 DIAGNOSIS — K5790 Diverticulosis of intestine, part unspecified, without perforation or abscess without bleeding: Secondary | ICD-10-CM | POA: Diagnosis not present

## 2022-06-23 DIAGNOSIS — K219 Gastro-esophageal reflux disease without esophagitis: Secondary | ICD-10-CM | POA: Diagnosis not present

## 2022-06-23 DIAGNOSIS — K5900 Constipation, unspecified: Secondary | ICD-10-CM | POA: Diagnosis not present

## 2022-06-23 DIAGNOSIS — K649 Unspecified hemorrhoids: Secondary | ICD-10-CM | POA: Diagnosis not present

## 2022-06-23 DIAGNOSIS — Z7902 Long term (current) use of antithrombotics/antiplatelets: Secondary | ICD-10-CM | POA: Diagnosis not present

## 2022-07-03 DIAGNOSIS — R051 Acute cough: Secondary | ICD-10-CM | POA: Diagnosis not present

## 2022-07-03 DIAGNOSIS — J069 Acute upper respiratory infection, unspecified: Secondary | ICD-10-CM | POA: Diagnosis not present

## 2022-07-15 DIAGNOSIS — N528 Other male erectile dysfunction: Secondary | ICD-10-CM | POA: Diagnosis not present

## 2022-07-15 DIAGNOSIS — R5383 Other fatigue: Secondary | ICD-10-CM | POA: Diagnosis not present

## 2022-07-15 DIAGNOSIS — I10 Essential (primary) hypertension: Secondary | ICD-10-CM | POA: Diagnosis not present

## 2022-07-15 DIAGNOSIS — E291 Testicular hypofunction: Secondary | ICD-10-CM | POA: Diagnosis not present

## 2022-07-15 DIAGNOSIS — G894 Chronic pain syndrome: Secondary | ICD-10-CM | POA: Diagnosis not present

## 2022-07-15 DIAGNOSIS — D500 Iron deficiency anemia secondary to blood loss (chronic): Secondary | ICD-10-CM | POA: Diagnosis not present

## 2022-07-15 DIAGNOSIS — R5381 Other malaise: Secondary | ICD-10-CM | POA: Diagnosis not present

## 2022-07-15 DIAGNOSIS — E782 Mixed hyperlipidemia: Secondary | ICD-10-CM | POA: Diagnosis not present

## 2022-07-15 DIAGNOSIS — E119 Type 2 diabetes mellitus without complications: Secondary | ICD-10-CM | POA: Diagnosis not present

## 2022-07-15 DIAGNOSIS — E1165 Type 2 diabetes mellitus with hyperglycemia: Secondary | ICD-10-CM | POA: Insufficient documentation

## 2022-07-15 DIAGNOSIS — H1033 Unspecified acute conjunctivitis, bilateral: Secondary | ICD-10-CM | POA: Diagnosis not present

## 2022-07-15 HISTORY — DX: Type 2 diabetes mellitus with hyperglycemia: E11.65

## 2022-07-19 DIAGNOSIS — M6281 Muscle weakness (generalized): Secondary | ICD-10-CM | POA: Diagnosis not present

## 2022-07-19 DIAGNOSIS — R42 Dizziness and giddiness: Secondary | ICD-10-CM | POA: Diagnosis not present

## 2022-07-19 DIAGNOSIS — E1165 Type 2 diabetes mellitus with hyperglycemia: Secondary | ICD-10-CM | POA: Diagnosis not present

## 2022-07-19 DIAGNOSIS — J449 Chronic obstructive pulmonary disease, unspecified: Secondary | ICD-10-CM | POA: Diagnosis not present

## 2022-07-19 DIAGNOSIS — R0981 Nasal congestion: Secondary | ICD-10-CM | POA: Diagnosis not present

## 2022-07-19 DIAGNOSIS — R0982 Postnasal drip: Secondary | ICD-10-CM | POA: Diagnosis not present

## 2022-07-19 DIAGNOSIS — R9431 Abnormal electrocardiogram [ECG] [EKG]: Secondary | ICD-10-CM | POA: Diagnosis not present

## 2022-07-19 DIAGNOSIS — J45909 Unspecified asthma, uncomplicated: Secondary | ICD-10-CM | POA: Diagnosis not present

## 2022-07-19 DIAGNOSIS — R531 Weakness: Secondary | ICD-10-CM | POA: Diagnosis not present

## 2022-07-23 ENCOUNTER — Telehealth: Payer: Self-pay

## 2022-07-23 NOTE — Telephone Encounter (Signed)
Transition Care Management Unsuccessful Follow-up Telephone Call  Date of discharge and from where:  [facility] 6/7  Attempts:  1st Attempt  Reason for unsuccessful TCM follow-up call:  Unable to leave message   Lenard Forth [facility] Guide, [facility] [phone] [address] [place], [place], [place] [address] Phone: [phone] Email: [email or web address]

## 2022-07-24 ENCOUNTER — Telehealth: Payer: Self-pay

## 2022-07-24 DIAGNOSIS — E119 Type 2 diabetes mellitus without complications: Secondary | ICD-10-CM | POA: Diagnosis not present

## 2022-07-24 DIAGNOSIS — N528 Other male erectile dysfunction: Secondary | ICD-10-CM | POA: Diagnosis not present

## 2022-07-24 DIAGNOSIS — J40 Bronchitis, not specified as acute or chronic: Secondary | ICD-10-CM | POA: Diagnosis not present

## 2022-07-24 DIAGNOSIS — Z6835 Body mass index (BMI) 35.0-35.9, adult: Secondary | ICD-10-CM | POA: Diagnosis not present

## 2022-07-24 DIAGNOSIS — Z8679 Personal history of other diseases of the circulatory system: Secondary | ICD-10-CM | POA: Diagnosis not present

## 2022-07-24 DIAGNOSIS — G459 Transient cerebral ischemic attack, unspecified: Secondary | ICD-10-CM | POA: Diagnosis not present

## 2022-07-24 DIAGNOSIS — I2585 Chronic coronary microvascular dysfunction: Secondary | ICD-10-CM | POA: Diagnosis not present

## 2022-07-24 NOTE — Telephone Encounter (Signed)
Transition Care Management Unsuccessful Follow-up Telephone Call  Date of discharge and from where:  [facility] 6/8  Attempts:  2nd Attempt  Reason for unsuccessful TCM follow-up call:   Unable to leave a message

## 2022-07-31 ENCOUNTER — Encounter: Payer: Self-pay | Admitting: Cardiology

## 2022-08-02 DIAGNOSIS — D2239 Melanocytic nevi of other parts of face: Secondary | ICD-10-CM | POA: Diagnosis not present

## 2022-08-02 DIAGNOSIS — D485 Neoplasm of uncertain behavior of skin: Secondary | ICD-10-CM | POA: Diagnosis not present

## 2022-08-02 DIAGNOSIS — L821 Other seborrheic keratosis: Secondary | ICD-10-CM | POA: Diagnosis not present

## 2022-08-02 DIAGNOSIS — D225 Melanocytic nevi of trunk: Secondary | ICD-10-CM | POA: Diagnosis not present

## 2022-08-04 DIAGNOSIS — R0981 Nasal congestion: Secondary | ICD-10-CM | POA: Diagnosis not present

## 2022-08-04 DIAGNOSIS — J309 Allergic rhinitis, unspecified: Secondary | ICD-10-CM | POA: Diagnosis not present

## 2022-08-04 DIAGNOSIS — R0982 Postnasal drip: Secondary | ICD-10-CM | POA: Diagnosis not present

## 2022-08-04 DIAGNOSIS — I10 Essential (primary) hypertension: Secondary | ICD-10-CM | POA: Diagnosis not present

## 2022-08-06 DIAGNOSIS — Z7985 Long-term (current) use of injectable non-insulin antidiabetic drugs: Secondary | ICD-10-CM | POA: Diagnosis not present

## 2022-08-06 DIAGNOSIS — I10 Essential (primary) hypertension: Secondary | ICD-10-CM | POA: Diagnosis not present

## 2022-08-06 DIAGNOSIS — E1165 Type 2 diabetes mellitus with hyperglycemia: Secondary | ICD-10-CM | POA: Diagnosis not present

## 2022-08-06 DIAGNOSIS — E119 Type 2 diabetes mellitus without complications: Secondary | ICD-10-CM | POA: Diagnosis not present

## 2022-08-06 DIAGNOSIS — R509 Fever, unspecified: Secondary | ICD-10-CM | POA: Diagnosis not present

## 2022-08-06 DIAGNOSIS — R29818 Other symptoms and signs involving the nervous system: Secondary | ICD-10-CM | POA: Diagnosis not present

## 2022-08-06 DIAGNOSIS — R0602 Shortness of breath: Secondary | ICD-10-CM | POA: Diagnosis not present

## 2022-08-06 DIAGNOSIS — R059 Cough, unspecified: Secondary | ICD-10-CM | POA: Diagnosis not present

## 2022-08-06 DIAGNOSIS — Z96611 Presence of right artificial shoulder joint: Secondary | ICD-10-CM | POA: Diagnosis not present

## 2022-08-07 DIAGNOSIS — R0602 Shortness of breath: Secondary | ICD-10-CM | POA: Diagnosis not present

## 2022-08-07 DIAGNOSIS — I10 Essential (primary) hypertension: Secondary | ICD-10-CM | POA: Diagnosis not present

## 2022-08-07 DIAGNOSIS — R42 Dizziness and giddiness: Secondary | ICD-10-CM | POA: Diagnosis not present

## 2022-08-07 DIAGNOSIS — J449 Chronic obstructive pulmonary disease, unspecified: Secondary | ICD-10-CM | POA: Diagnosis not present

## 2022-08-10 DIAGNOSIS — Z7985 Long-term (current) use of injectable non-insulin antidiabetic drugs: Secondary | ICD-10-CM | POA: Diagnosis not present

## 2022-08-10 DIAGNOSIS — Z7984 Long term (current) use of oral hypoglycemic drugs: Secondary | ICD-10-CM | POA: Diagnosis not present

## 2022-08-10 DIAGNOSIS — E1165 Type 2 diabetes mellitus with hyperglycemia: Secondary | ICD-10-CM | POA: Diagnosis not present

## 2022-08-11 DIAGNOSIS — E1165 Type 2 diabetes mellitus with hyperglycemia: Secondary | ICD-10-CM | POA: Diagnosis not present

## 2022-08-11 DIAGNOSIS — I10 Essential (primary) hypertension: Secondary | ICD-10-CM | POA: Diagnosis not present

## 2022-08-13 ENCOUNTER — Telehealth: Payer: Self-pay

## 2022-08-13 NOTE — Telephone Encounter (Signed)
Transition Care Management Unsuccessful Follow-up Telephone Call  Date of discharge and from where:  08/06/2022 [facility]  Attempts:  2nd Attempt  Reason for unsuccessful TCM follow-up call:  Unable to reach patient  Chou Busler [facility]  [facility] Population Health Community Resource Care Guide   [email or web address]  ?? [phone]   Website: [email or web address]  [email or web address]

## 2022-08-13 NOTE — Telephone Encounter (Signed)
Transition Care Management Unsuccessful Follow-up Telephone Call  Date of discharge and from where:  08/06/2022 [facility]  Attempts:  1st Attempt  Reason for unsuccessful TCM follow-up call:  No answer/busy  Storm Sovine [facility]  [facility] Population Health Community Resource Care Guide   [email or web address]  ?? [phone]   Website: [email or web address]  [email or web address]

## 2022-08-15 DIAGNOSIS — E119 Type 2 diabetes mellitus without complications: Secondary | ICD-10-CM | POA: Diagnosis not present

## 2022-08-15 DIAGNOSIS — R110 Nausea: Secondary | ICD-10-CM | POA: Diagnosis not present

## 2022-08-15 DIAGNOSIS — R682 Dry mouth, unspecified: Secondary | ICD-10-CM | POA: Diagnosis not present

## 2022-08-15 DIAGNOSIS — R5383 Other fatigue: Secondary | ICD-10-CM | POA: Diagnosis not present

## 2022-08-17 DIAGNOSIS — J208 Acute bronchitis due to other specified organisms: Secondary | ICD-10-CM | POA: Diagnosis not present

## 2022-08-17 DIAGNOSIS — E1144 Type 2 diabetes mellitus with diabetic amyotrophy: Secondary | ICD-10-CM | POA: Diagnosis not present

## 2022-08-18 DIAGNOSIS — R059 Cough, unspecified: Secondary | ICD-10-CM | POA: Diagnosis not present

## 2022-08-18 DIAGNOSIS — Z981 Arthrodesis status: Secondary | ICD-10-CM | POA: Diagnosis not present

## 2022-08-18 DIAGNOSIS — J40 Bronchitis, not specified as acute or chronic: Secondary | ICD-10-CM | POA: Diagnosis not present

## 2022-08-18 DIAGNOSIS — Z96611 Presence of right artificial shoulder joint: Secondary | ICD-10-CM | POA: Diagnosis not present

## 2022-08-18 DIAGNOSIS — R051 Acute cough: Secondary | ICD-10-CM | POA: Diagnosis not present

## 2022-08-18 DIAGNOSIS — R0602 Shortness of breath: Secondary | ICD-10-CM | POA: Diagnosis not present

## 2022-08-21 DIAGNOSIS — R0602 Shortness of breath: Secondary | ICD-10-CM | POA: Diagnosis not present

## 2022-08-21 DIAGNOSIS — R051 Acute cough: Secondary | ICD-10-CM | POA: Diagnosis not present

## 2022-08-21 DIAGNOSIS — J40 Bronchitis, not specified as acute or chronic: Secondary | ICD-10-CM | POA: Diagnosis not present

## 2022-09-05 DIAGNOSIS — C44519 Basal cell carcinoma of skin of other part of trunk: Secondary | ICD-10-CM | POA: Diagnosis not present

## 2022-09-07 NOTE — Progress Notes (Unsigned)
Cardiology Office Note:    Date:  09/08/2022   ID:  Clarence Larson, DOB Jul 08, 1965, MRN [id number]  PCP:  Audie Pinto, FNP  Cardiologist:  Norman Herrlich, MD   Referring MD: Audie Pinto, FNP  ASSESSMENT:    1. SOB (shortness of breath) on exertion   2. Chronic combined systolic and diastolic heart failure (HCC)   3. Hypertensive heart disease with heart failure (HCC)   4. Mixed hyperlipidemia   5. Type 2 diabetes mellitus without complication, without long-term current use of insulin (HCC)   6. Coronary artery calcification seen on CT scan    PLAN:    In order of problems listed above:  He clearly has congestive heart failure is fluid overloaded he is decompensated.  Will optimize his care by transition from losartan to Entresto midrange dose doubled the dose of his loop diuretic and initiate an SGLT2 inhibitor recheck labs in 2 weeks and make a decision regarding spironolactone. For now continue his current diuretic with his focus on sexual dysfunction make a decision whether to transition to carvedilol next visit Recheck an echocardiogram I am quite concerned he has had progressive left ventricular dysfunction since last December He may require an ischemia evaluation CTA once compensated with his coronary artery calcification he tells me he has mild carotid disease He is on semaglutide for diabetes quite favorable in patients with heart failure  Next appointment 6 weeks   Medication Adjustments/Labs and Tests Ordered: Current medicines are reviewed at length with the patient today.  Concerns regarding medicines are outlined above.  Orders Placed This Encounter  Procedures   EKG 12-Lead   No orders of the defined types were placed in this encounter.      History of Present Illness:    Clarence Larson is a 57 y.o. male who is being seen today for the evaluation of shortness of breath at the request of [name], Gwenith Spitz, FNP.  This morning I was given records from  the [facility] visit 08/10/2022 for elevated blood sugar hemoglobin was 14.0 platelets 198,000 creatinine 0.8 potassium 4.9 blood sugar was 209's EKG 08/07/2022 showed sinus rhythm and was normal there did not appear to be any cardiovascular issues during that visit  He was seen at urgent care [facility] 08/18/2022 for shortness of breath.  He was diagnosed with bronchitis and treated with a bronchodilator.  He has a history of heart failure microvascular coronary artery disease hypertension type 2 diabetes in his record relates he had a previous TIA.  Chest x-ray the day of the urgent care visit showed no cardiopulmonary abnormality.    He was seen with his PCP 08/11/2022 after multiple emergency room visits [facility].  He was seen [facility] admitted 02/13/2022 discharged the next day with a diagnosis of TIA hypertension and hyper lipidemia  EKG 07/19/2022 again at [facility] showed sinus rhythm poor R wave progression otherwise normal  He was seen by pulmonary medicine [facility] were 03/11/2022 with mild persistent asthma.  And also relates he has a history of sleep apnea CPAP intolerant.  He was seen at [facility] for shortness of breath 02/04/2022 there is a notation that he was recently diagnosed with heart failure with mildly reduced ejection fraction and was scheduled to be seen by pulmonary.  His BNP level was low at 66 in the rule out range for heart failure sodium 134 potassium 4.1 creatinine 1.11 GFR 77 cc/min chest x-ray showed mild cardiomegaly without findings of heart failure and his EKG was described as sinus rhythm and  normal  An echocardiogram performed 02/04/2022 showing mild LVH mild left ventricular enlargement mildly decreased EF 40 to 45% with global hypokinesia right ventricle is normal in size left atrium normal the right atrium was mildly enlarged.  He had mild calcification of the aortic valve.  Chest CTA performed 02/02/2022 showing no finding of pulmonary embolism and mild coronary calcification and atherosclerosis.  Is a very complex story he does not appreciate that he has underlying heart disease 4 years ago is take amlodipine developed lower extremity edema stopped and it resolved Last December he was having a respiratory infection he calls the carotid was admitted to the hospital [facility] tells me he was seen by cardiologist and told that he had heart failure due to obesity. He has been taking a low-dose loop diuretic did not realize that he has 4+ edema ankles to knees he is also having orthopnea and he sleeps in a recliner out of habit. He has had no chest pain palpitation or syncope He tells me several years ago he had a nuclear myocardial perfusion study [facility] he was told he had a strong heart Is very focused on sexual dysfunction. Past Medical History:  Diagnosis Date   Chronic coronary microvascular dysfunction    History of CHF (congestive heart failure)    Hypertension    TIA (transient ischemic attack)    Type 2 diabetes mellitus (HCC)     Past Surgical History:  Procedure Laterality Date   ARTHROPLASTY     BRAIN SURGERY     NECK SURGERY     ROTATOR CUFF REPAIR      Current Medications: Current Meds  Medication Sig   azithromycin (ZITHROMAX) 250 MG tablet Take 250 mg by mouth daily.   brompheniramine-pseudoephedrine-DM 30-2-10 MG/5ML syrup Take 2.5 mLs by mouth 4 (four) times daily as needed.   Budeson-Glycopyrrol-Formoterol (BREZTRI AEROSPHERE) 160-9-4.8 MCG/ACT AERO Inhale 2 puffs into the lungs 2 (two) times daily.   clopidogrel (PLAVIX)  75 MG tablet Take 75 mg by mouth daily.   co-enzyme Q-10 30 MG capsule Take 30 mg by mouth daily.   dextromethorphan-guaiFENesin (ROBITUSSIN-DM) 10-100 MG/5ML liquid Take 5 mLs by mouth every 6 (six) hours as needed for cough.   famotidine (PEPCID) 20 MG tablet Take 20 mg by mouth daily.   fluticasone (FLONASE) 50 MCG/ACT nasal spray Place 2 sprays into both nostrils daily.   furosemide (LASIX) 20 MG tablet Take 20 mg by mouth daily.   hydrOXYzine (ATARAX) 25 MG tablet Take 25 mg by mouth 2 (two) times daily.   ipratropium-albuterol (DUONEB) 0.5-2.5 (3) MG/3ML SOLN Take 3 mLs by nebulization every 6 (six) hours as needed.   loratadine (CLARITIN) 10 MG tablet Take 10 mg by mouth daily.   losartan (COZAAR) 50 MG tablet Take 50 mg by mouth  daily.   metformin (FORTAMET) 500 MG (OSM) 24 hr tablet Take 500 mg by mouth 2 (two) times daily with a meal.   montelukast (SINGULAIR) 10 MG tablet Take 10 mg by mouth at bedtime.   Multiple Vitamin (MULTIVITAMIN) capsule Take 1 capsule by mouth daily.   nebivolol (BYSTOLIC) 10 MG tablet Take 10 mg by mouth daily.   ofloxacin (FLOXIN) 0.3 % OTIC solution 5 drops daily.   ofloxacin (OCUFLOX) 0.3 % ophthalmic solution Place 1 drop into both eyes 4 (four) times daily.   oxycodone (ROXICODONE) 30 MG immediate release tablet Take 30 mg by mouth every 4 (four) hours as needed for pain.   OZEMPIC, 0.25 OR 0.5 MG/DOSE, 2 MG/3ML SOPN Inject 0.25 mg into the skin once a week.   rosuvastatin (CRESTOR) 10 MG tablet Take 10 mg by mouth daily.   tadalafil (CIALIS) 20 MG tablet Take 20 mg by mouth daily as needed for erectile dysfunction.   testosterone cypionate (DEPOTESTOSTERONE CYPIONATE) 200 MG/ML injection Inject 200 mg into the muscle once a week.     Allergies:   Atomoxetine, Esomeprazole magnesium, Hydrocodone, and Codeine   Social History   Socioeconomic History   Marital status: Single    Spouse name: Not on file   Number of children: Not on file   Years of  education: Not on file   Highest education level: Not on file  Occupational History   Not on file  Tobacco Use   Smoking status: Never   Smokeless tobacco: Never  Vaping Use   Vaping status: Never Used  Substance and Sexual Activity   Alcohol use: Yes    Alcohol/week: 3.0 - 4.0 standard drinks of alcohol    Types: 3 - 4 Cans of beer per week   Drug use: No   Sexual activity: Yes  Other Topics Concern   Not on file  Social History Narrative   Not on file   Social Determinants of Health   Financial Resource Strain: Not on file  Food Insecurity: Low Risk  (02/03/2022)   [facility]   Food vital sign    Within the past 12 months, you worried that your food would run out before you got money to buy more: Never true    Within the past 12 months, the food you bought just didn't last and you didn't have money to get more: Not on file  Transportation Needs: Unmet Transportation Needs (02/03/2022)   [facility]   Transportation    In the past 12 months, has lack of reliable transportation kept you from medical appointments, meetings, work or from getting things needed for daily living? : Yes  Physical Activity: Not on file  Stress: Not on file  Social Connections: Unknown (06/21/2021)   Received from [facility]   Social Network    Social Network: Not on file     Family History: The patient's family history includes Alzheimer's disease in his mother; Heart disease in his father; Testicular cancer in his brother.  ROS:   ROS Please see the history of present illness.     All other systems reviewed and are negative.  EKGs/Labs/Other Studies Reviewed:    The following studies were reviewed today:  EKG Interpretation Date/Time:  Monday September 08 2022 10:45:56 EDT Ventricular Rate:  81 PR Interval:  182 QRS Duration:  104 QT Interval:  374 QTC Calculation: 434 R Axis:   39  Text Interpretation: Normal sinus  rhythm Normal ECG When compared with ECG of 04-Apr-2017 00:59, Questionable change in QRS axis T wave inversion no longer evident in Inferior leads Nonspecific T wave abnormality now evident in Lateral leads Confirmed by Norman Herrlich ([id number]) on 09/08/2022 10:50:56 AM    Recent Labs: No results found for requested labs within last 365 days.  Recent Lipid Panel No results found for: "CHOL", "TRIG", "HDL", "CHOLHDL", "VLDL", "LDLCALC", "LDLDIRECT"  Physical Exam:    VS:  BP (!) 140/84 (BP Location: Right Arm, Patient Position: Sitting, Cuff Size: Normal)   Pulse 81   Ht 5\' 11"  (1.803 m)   Wt 260 lb (117.9 kg)   SpO2 91%   BMI 36.26 kg/m     Wt Readings from Last 3 Encounters:  09/08/22 260 lb (117.9 kg)  07/24/22 269 lb (122 kg)  04/04/17 230 lb (104.3 kg)  He appears mildly short of breath and is having cough paroxysms of my office  GEN:  Well nourished, well developed in no acute distress HEENT: Normal NECK: No JVD; No carotid bruits LYMPHATICS: No lymphadenopathy CARDIAC: End expiratory wheezing distant heart sounds RRR, no murmurs, rubs, gallops RESPIRATORY:  Clear to auscultation without rales, wheezing or rhonchi  ABDOMEN: Soft, non-tender, non-distended MUSCULOSKELETAL: He has 3-4+ lower extremity pitting edema ankle to knee edema; No deformity  SKIN: Warm and dry NEUROLOGIC:  Alert and oriented x 3 PSYCHIATRIC:  Normal affect     Signed, Norman Herrlich, MD  09/08/2022 11:07 AM    [facility]

## 2022-09-08 ENCOUNTER — Encounter: Payer: Self-pay | Admitting: Cardiology

## 2022-09-08 ENCOUNTER — Ambulatory Visit: Payer: PPO | Attending: Cardiology | Admitting: Cardiology

## 2022-09-08 VITALS — BP 140/84 | HR 81 | Ht 71.0 in | Wt 260.0 lb

## 2022-09-08 DIAGNOSIS — I110 Hypertensive heart disease with heart failure: Secondary | ICD-10-CM

## 2022-09-08 DIAGNOSIS — Z7984 Long term (current) use of oral hypoglycemic drugs: Secondary | ICD-10-CM | POA: Diagnosis not present

## 2022-09-08 DIAGNOSIS — I2510 Atherosclerotic heart disease of native coronary artery without angina pectoris: Secondary | ICD-10-CM | POA: Diagnosis not present

## 2022-09-08 DIAGNOSIS — R0602 Shortness of breath: Secondary | ICD-10-CM | POA: Diagnosis not present

## 2022-09-08 DIAGNOSIS — I5042 Chronic combined systolic (congestive) and diastolic (congestive) heart failure: Secondary | ICD-10-CM

## 2022-09-08 DIAGNOSIS — E119 Type 2 diabetes mellitus without complications: Secondary | ICD-10-CM

## 2022-09-08 DIAGNOSIS — E782 Mixed hyperlipidemia: Secondary | ICD-10-CM

## 2022-09-08 MED ORDER — DAPAGLIFLOZIN PROPANEDIOL 10 MG PO TABS: 10.0000 mg | ORAL_TABLET | Freq: Every day | ORAL | 3 refills | Status: DC

## 2022-09-08 MED ORDER — FUROSEMIDE 40 MG PO TABS: 40.0000 mg | ORAL_TABLET | Freq: Every day | ORAL | 3 refills | Status: AC

## 2022-09-08 MED ORDER — ENTRESTO 49-51 MG PO TABS: 1.0000 | ORAL_TABLET | Freq: Two times a day (BID) | ORAL | 3 refills | Status: DC

## 2022-09-08 NOTE — Patient Instructions (Addendum)
Medication Instructions:  Your physician has recommended you make the following change in your medication:   STOP: Losartan START: Furosemide 40 mg daily START: Entresto 49/51 twice daily START: Farxiga 10 mg daily  *If you need a refill on your cardiac medications before your next appointment, please call your pharmacy*   Lab Work: Your physician recommends that you return for lab work in:   Labs in 2 weeks: BMP, Pro BNP  If you have labs (blood work) drawn today and your tests are completely normal, you will receive your results only by: MyChart Message (if you have MyChart) OR A paper copy in the mail If you have any lab test that is abnormal or we need to change your treatment, we will call you to review the results.   Testing/Procedures: Your physician has requested that you have an echocardiogram. Echocardiography is a painless test that uses sound waves to create images of your heart. It provides your doctor with information about the size and shape of your heart and how well your heart's chambers and valves are working. This procedure takes approximately one hour. There are no restrictions for this procedure. Please do NOT wear cologne, perfume, aftershave, or lotions (deodorant is allowed). Please arrive 15 minutes prior to your appointment time.    Follow-Up: At [facility], you and your health needs are our priority.  As part of our continuing mission to provide you with exceptional heart care, we have created designated Provider Care Teams.  These Care Teams include your primary Cardiologist (physician) and Advanced Practice Providers (APPs -  Physician Assistants and Nurse Practitioners) who all work together to provide you with the care you need, when you need it.  We recommend signing up for the patient portal called "MyChart".  Sign up information is provided on this After Visit Summary.  MyChart is used to connect with patients for Virtual Visits  (Telemedicine).  Patients are able to view lab/test results, encounter notes, upcoming appointments, etc.  Non-urgent messages can be sent to your provider as well.   To learn more about what you can do with MyChart, go to [email or web address].    Your next appointment:   6 week(s)  Provider:   Norman Herrlich, MD    Other Instructions Check blood pressure daily and record  Heart Failure  Weigh yourself every morning when you first wake up and record on a calender or note pad, bring this to your office visits. Using a pill tender can help with taking your medications consistently.  Limit your fluid intake to 2 liters daily  Limit your sodium intake to less than 2-3 grams daily. Ask if you need dietary teaching.  If you gain more than 3 pounds (from your dry weight ), double your dose of diuretic for the day.  If you gain more than 5 pounds (from your dry weight), double your dose of lasix and call your heart failure doctor.  Please do not smoke tobacco since it is very bad for your heart.  Please do not drink alcohol since it can worsen your heart failure.Also avoid OTC nonsteroidal drugs, such as advil, aleve and motrin.  Try to exercise for at least 30 minutes every day because this will help your heart be more efficient. You may be eligible for supervised cardiac rehab, ask your physician.    [facility]  Tips to measure your blood pressure correctly  To determine whether you have hypertension, a medical professional will take a blood pressure reading. How  you prepare for the test, the position of your arm, and other factors can change a blood pressure reading by 10% or more. That could be enough to hide high blood pressure, start you on a drug you don't really need, or lead your doctor to incorrectly adjust your medications. National and international guidelines offer specific instructions for measuring blood pressure. If a doctor, nurse, or medical assistant isn't doing it  right, don't hesitate to ask him or her to get with the guidelines. Here's what you can do to ensure a correct reading:  Don't drink a caffeinated beverage or smoke during the 30 minutes before the test.  Sit quietly for five minutes before the test begins.  During the measurement, sit in a chair with your feet on the floor and your arm supported so your elbow is at about heart level.  The inflatable part of the cuff should completely cover at least 80% of your upper arm, and the cuff should be placed on bare skin, not over a shirt.  Don't talk during the measurement.  Have your blood pressure measured twice, with a brief break in between. If the readings are different by 5 points or more, have it done a third time. There are times to break these rules. If you sometimes feel lightheaded when getting out of bed in the morning or when you stand after sitting, you should have your blood pressure checked while seated and then while standing to see if it falls from one position to the next. Because blood pressure varies throughout the day, your doctor will rarely diagnose hypertension on the basis of a single reading. Instead, he or she will want to confirm the measurements on at least two occasions, usually within a few weeks of one another. The exception to this rule is if you have a blood pressure reading of 180/110 mm Hg or higher. A result this high usually calls for prompt treatment. It's also a good idea to have your blood pressure measured in both arms at least once, since the reading in one arm (usually the right) may be higher than that in the left. A 2014 study in The American Journal of Medicine of nearly 3,400 people found average arm- to-arm differences in systolic blood pressure of about 5 points. The higher number should be used to make treatment decisions. In 2017, new guidelines from the American Heart Association, the [employer] of Cardiology, and nine other health organizations  lowered the diagnosis of high blood pressure to 130/80 mm Hg or higher for all adults. The guidelines also redefined the various blood pressure categories to now include normal, elevated, Stage 1 hypertension, Stage 2 hypertension, and hypertensive crisis (see "Blood pressure categories"). Blood pressure categories  Blood pressure category SYSTOLIC (upper number)  DIASTOLIC (lower number)  Normal Less than 120 mm Hg and Less than 80 mm Hg  Elevated 120-129 mm Hg and Less than 80 mm Hg  High blood pressure: Stage 1 hypertension 130-139 mm Hg or 80-89 mm Hg  High blood pressure: Stage 2 hypertension 140 mm Hg or higher or 90 mm Hg or higher  Hypertensive crisis (consult your doctor immediately) Higher than 180 mm Hg and/or Higher than 120 mm Hg  Source: American Heart Association and American Stroke Association. For more on getting your blood pressure under control, buy Controlling Your Blood Pressure, a Special Health Report from [facility].   DASH Diet: Care Instructions Your Care Instructions The DASH diet is an eating plan  that can help lower your blood pressure. DASH stands for Dietary Approaches to Stop Hypertension. Hypertension is high blood pressure. The DASH diet focuses on eating foods that are high in calcium, potassium, and magnesium. These nutrients can lower blood pressure. The foods that are highest in these nutrients are fruits, vegetables, low-fat dairy products, nuts, seeds, and legumes. But taking calcium, potassium, and magnesium supplements instead of eating foods that are high in those nutrients does not have the same effect. The DASH diet also includes whole grains, fish, and poultry. The DASH diet is one of several lifestyle changes your doctor may recommend to lower your high blood pressure. Your doctor may also want you to decrease the amount of sodium in your diet. Lowering sodium while following the DASH diet can lower blood pressure even further than just the  DASH diet alone. Follow-up care is a key part of your treatment and safety. Be sure to make and go to all appointments, and call your doctor if you are having problems. It's also a good idea to know your test results and keep a list of the medicines you take. How can you care for yourself at home? Following the DASH diet  Eat 4 to 5 servings of fruit each day. A serving is 1 medium-sized piece of fruit,  cup chopped or canned fruit, 1/4 cup dried fruit, or 4 ounces ( cup) of fruit juice. Choose fruit more often than fruit juice.  Eat 4 to 5 servings of vegetables each day. A serving is 1 cup of lettuce or raw leafy vegetables,  cup of chopped or cooked vegetables, or 4 ounces ( cup) of vegetable juice. Choose vegetables more often than vegetable juice.  Get 2 to 3 servings of low-fat and fat-free dairy each day. A serving is 8 ounces of milk, 1 cup of yogurt, or 1  ounces of cheese.  Eat 6 to 8 servings of grains each day. A serving is 1 slice of bread, 1 ounce of dry cereal, or  cup of cooked rice, pasta, or cooked cereal. Try to choose whole-grain products as much as possible.  Limit lean meat, poultry, and fish to 2 servings each day. A serving is 3 ounces, about the size of a deck of cards.  Eat 4 to 5 servings of nuts, seeds, and legumes (cooked dried beans, lentils, and split peas) each week. A serving is 1/3 cup of nuts, 2 tablespoons of seeds, or  cup of cooked beans or peas.  Limit fats and oils to 2 to 3 servings each day. A serving is 1 teaspoon of vegetable oil or 2 tablespoons of salad dressing.  Limit sweets and added sugars to 5 servings or less a week. A serving is 1 tablespoon jelly or jam,  cup sorbet, or 1 cup of lemonade.  Eat less than 2,300 milligrams (mg) of sodium a day. If you limit your sodium to 1,500 mg a day, you can lower your blood pressure even more. Tips for success  Start small. Do not try to make dramatic changes to your diet all at once. You might  feel that you are missing out on your favorite foods and then be more likely to not follow the plan. Make small changes, and stick with them. Once those changes become habit, add a few more changes.  Try some of the following:  Make it a goal to eat a fruit or vegetable at every meal and at snacks. This will make it easy to get  the recommended amount of fruits and vegetables each day.  Try yogurt topped with fruit and nuts for a snack or healthy dessert.  Add lettuce, tomato, cucumber, and onion to sandwiches.  Combine a ready-made pizza crust with low-fat mozzarella cheese and lots of vegetable toppings. Try using tomatoes, squash, spinach, broccoli, carrots, cauliflower, and onions.  Have a variety of cut-up vegetables with a low-fat dip as an appetizer instead of chips and dip.  Sprinkle sunflower seeds or chopped almonds over salads. Or try adding chopped walnuts or almonds to cooked vegetables.  Try some vegetarian meals using beans and peas. Add garbanzo or kidney beans to salads. Make burritos and tacos with mashed pinto beans or black beans. Where can you learn more? Go to https://carlson-fletcher.info/. Enter [phone] in the search box to learn more about "DASH Diet: Care Instructions." Current as of: May 03, 2014 Content Version: 11.1  2006-2016 [employer]. Care instructions adapted under license by [facility], [place]. If you have questions about a medical condition or this instruction, always ask your healthcare professional. [employer] disclaims any warranty or liability for your use of this information.

## 2022-09-09 DIAGNOSIS — G8929 Other chronic pain: Secondary | ICD-10-CM | POA: Diagnosis not present

## 2022-09-14 DIAGNOSIS — R051 Acute cough: Secondary | ICD-10-CM | POA: Diagnosis not present

## 2022-09-14 DIAGNOSIS — R0981 Nasal congestion: Secondary | ICD-10-CM | POA: Diagnosis not present

## 2022-09-14 DIAGNOSIS — R062 Wheezing: Secondary | ICD-10-CM | POA: Diagnosis not present

## 2022-09-18 DIAGNOSIS — J029 Acute pharyngitis, unspecified: Secondary | ICD-10-CM | POA: Diagnosis not present

## 2022-09-18 DIAGNOSIS — Z20822 Contact with and (suspected) exposure to covid-19: Secondary | ICD-10-CM | POA: Diagnosis not present

## 2022-09-22 DIAGNOSIS — E782 Mixed hyperlipidemia: Secondary | ICD-10-CM | POA: Diagnosis not present

## 2022-09-22 DIAGNOSIS — E119 Type 2 diabetes mellitus without complications: Secondary | ICD-10-CM | POA: Diagnosis not present

## 2022-09-22 DIAGNOSIS — Z6838 Body mass index (BMI) 38.0-38.9, adult: Secondary | ICD-10-CM | POA: Diagnosis not present

## 2022-09-22 DIAGNOSIS — R5381 Other malaise: Secondary | ICD-10-CM | POA: Diagnosis not present

## 2022-09-22 DIAGNOSIS — G894 Chronic pain syndrome: Secondary | ICD-10-CM | POA: Diagnosis not present

## 2022-09-22 DIAGNOSIS — M25522 Pain in left elbow: Secondary | ICD-10-CM | POA: Diagnosis not present

## 2022-09-22 DIAGNOSIS — Z125 Encounter for screening for malignant neoplasm of prostate: Secondary | ICD-10-CM | POA: Diagnosis not present

## 2022-09-22 DIAGNOSIS — K219 Gastro-esophageal reflux disease without esophagitis: Secondary | ICD-10-CM | POA: Diagnosis not present

## 2022-09-22 DIAGNOSIS — G459 Transient cerebral ischemic attack, unspecified: Secondary | ICD-10-CM | POA: Diagnosis not present

## 2022-09-22 DIAGNOSIS — M25422 Effusion, left elbow: Secondary | ICD-10-CM | POA: Diagnosis not present

## 2022-09-22 DIAGNOSIS — Z0000 Encounter for general adult medical examination without abnormal findings: Secondary | ICD-10-CM | POA: Diagnosis not present

## 2022-09-22 DIAGNOSIS — I10 Essential (primary) hypertension: Secondary | ICD-10-CM | POA: Diagnosis not present

## 2022-09-22 DIAGNOSIS — R5383 Other fatigue: Secondary | ICD-10-CM | POA: Diagnosis not present

## 2022-09-30 DIAGNOSIS — M7022 Olecranon bursitis, left elbow: Secondary | ICD-10-CM | POA: Diagnosis not present

## 2022-10-01 ENCOUNTER — Ambulatory Visit: Payer: PPO | Attending: Cardiology

## 2022-10-03 DIAGNOSIS — C44619 Basal cell carcinoma of skin of left upper limb, including shoulder: Secondary | ICD-10-CM | POA: Diagnosis not present

## 2022-10-03 DIAGNOSIS — D485 Neoplasm of uncertain behavior of skin: Secondary | ICD-10-CM | POA: Diagnosis not present

## 2022-10-03 DIAGNOSIS — H61001 Unspecified perichondritis of right external ear: Secondary | ICD-10-CM | POA: Diagnosis not present

## 2022-10-15 DIAGNOSIS — M19022 Primary osteoarthritis, left elbow: Secondary | ICD-10-CM | POA: Diagnosis not present

## 2022-10-15 DIAGNOSIS — Z8701 Personal history of pneumonia (recurrent): Secondary | ICD-10-CM | POA: Diagnosis not present

## 2022-10-15 DIAGNOSIS — G8929 Other chronic pain: Secondary | ICD-10-CM | POA: Diagnosis not present

## 2022-10-15 DIAGNOSIS — M71022 Abscess of bursa, left elbow: Secondary | ICD-10-CM | POA: Diagnosis not present

## 2022-10-15 DIAGNOSIS — E119 Type 2 diabetes mellitus without complications: Secondary | ICD-10-CM | POA: Diagnosis not present

## 2022-10-15 DIAGNOSIS — M71122 Other infective bursitis, left elbow: Secondary | ICD-10-CM | POA: Diagnosis not present

## 2022-10-15 DIAGNOSIS — Z7902 Long term (current) use of antithrombotics/antiplatelets: Secondary | ICD-10-CM | POA: Diagnosis not present

## 2022-10-15 DIAGNOSIS — M1990 Unspecified osteoarthritis, unspecified site: Secondary | ICD-10-CM | POA: Diagnosis not present

## 2022-10-15 DIAGNOSIS — D649 Anemia, unspecified: Secondary | ICD-10-CM | POA: Diagnosis not present

## 2022-10-15 DIAGNOSIS — Z7982 Long term (current) use of aspirin: Secondary | ICD-10-CM | POA: Diagnosis not present

## 2022-10-15 DIAGNOSIS — B9562 Methicillin resistant Staphylococcus aureus infection as the cause of diseases classified elsewhere: Secondary | ICD-10-CM | POA: Diagnosis not present

## 2022-10-15 DIAGNOSIS — J449 Chronic obstructive pulmonary disease, unspecified: Secondary | ICD-10-CM | POA: Diagnosis not present

## 2022-10-15 DIAGNOSIS — E039 Hypothyroidism, unspecified: Secondary | ICD-10-CM | POA: Diagnosis not present

## 2022-10-15 DIAGNOSIS — Z7985 Long-term (current) use of injectable non-insulin antidiabetic drugs: Secondary | ICD-10-CM | POA: Diagnosis not present

## 2022-10-15 DIAGNOSIS — Z885 Allergy status to narcotic agent status: Secondary | ICD-10-CM | POA: Diagnosis not present

## 2022-10-15 DIAGNOSIS — Z7984 Long term (current) use of oral hypoglycemic drugs: Secondary | ICD-10-CM | POA: Diagnosis not present

## 2022-10-15 DIAGNOSIS — Z79899 Other long term (current) drug therapy: Secondary | ICD-10-CM | POA: Diagnosis not present

## 2022-10-15 DIAGNOSIS — E7800 Pure hypercholesterolemia, unspecified: Secondary | ICD-10-CM | POA: Diagnosis not present

## 2022-10-15 DIAGNOSIS — Z79891 Long term (current) use of opiate analgesic: Secondary | ICD-10-CM | POA: Diagnosis not present

## 2022-10-15 DIAGNOSIS — Z8673 Personal history of transient ischemic attack (TIA), and cerebral infarction without residual deficits: Secondary | ICD-10-CM | POA: Diagnosis not present

## 2022-10-15 DIAGNOSIS — I10 Essential (primary) hypertension: Secondary | ICD-10-CM | POA: Diagnosis not present

## 2022-10-21 DIAGNOSIS — M7022 Olecranon bursitis, left elbow: Secondary | ICD-10-CM | POA: Diagnosis not present

## 2022-10-27 ENCOUNTER — Ambulatory Visit: Payer: PPO | Admitting: Cardiology

## 2022-11-03 DIAGNOSIS — M7022 Olecranon bursitis, left elbow: Secondary | ICD-10-CM | POA: Diagnosis not present

## 2022-11-04 ENCOUNTER — Other Ambulatory Visit: Payer: PPO

## 2022-11-05 ENCOUNTER — Ambulatory Visit: Payer: PPO | Attending: Cardiology

## 2022-11-05 DIAGNOSIS — I110 Hypertensive heart disease with heart failure: Secondary | ICD-10-CM | POA: Diagnosis not present

## 2022-11-05 DIAGNOSIS — R0602 Shortness of breath: Secondary | ICD-10-CM | POA: Diagnosis not present

## 2022-11-05 DIAGNOSIS — E119 Type 2 diabetes mellitus without complications: Secondary | ICD-10-CM | POA: Diagnosis not present

## 2022-11-05 DIAGNOSIS — I5042 Chronic combined systolic (congestive) and diastolic (congestive) heart failure: Secondary | ICD-10-CM | POA: Diagnosis not present

## 2022-11-05 DIAGNOSIS — I2510 Atherosclerotic heart disease of native coronary artery without angina pectoris: Secondary | ICD-10-CM | POA: Diagnosis not present

## 2022-11-05 DIAGNOSIS — E782 Mixed hyperlipidemia: Secondary | ICD-10-CM

## 2022-11-05 LAB — ECHOCARDIOGRAM COMPLETE
Calc EF: 63.9 %
S' Lateral: 2.4 cm
Single Plane A2C EF: 65.4 %
Single Plane A4C EF: 62 %

## 2022-11-11 ENCOUNTER — Other Ambulatory Visit: Payer: Self-pay

## 2022-11-11 ENCOUNTER — Telehealth: Payer: Self-pay | Admitting: Cardiology

## 2022-11-11 MED ORDER — VALSARTAN 80 MG PO TABS: 80.0000 mg | ORAL_TABLET | Freq: Two times a day (BID) | ORAL | 3 refills | Status: DC

## 2022-11-11 NOTE — Telephone Encounter (Signed)
Called the patient and he reported that he was unable to afford his Entresto medication. Spoke to Dr. Dulce Sellar and he recommended to start him on Valsartan 80 mg BID if he was not able to afford the [facility]. I relayed Dr. Hulen Shouts recommendation to the patient and the patient agreed to switch to the Valsartan medication. Patient had no further questions at this time.

## 2022-11-11 NOTE — Telephone Encounter (Signed)
Pt c/o medication issue:  1. Name of Medication: sacubitril-valsartan (ENTRESTO) 49-51 MG   2. How are you currently taking this medication (dosage and times per day)?    3. Are you having a reaction (difficulty breathing--STAT)? no  4. What is your medication issue? Patient states medication is too expensive. Calling to see if there is another medication, he can be put on. Please advise

## 2022-11-14 DIAGNOSIS — M25562 Pain in left knee: Secondary | ICD-10-CM | POA: Diagnosis not present

## 2022-11-14 DIAGNOSIS — M25552 Pain in left hip: Secondary | ICD-10-CM | POA: Diagnosis not present

## 2022-11-14 DIAGNOSIS — S79912A Unspecified injury of left hip, initial encounter: Secondary | ICD-10-CM | POA: Diagnosis not present

## 2022-11-14 DIAGNOSIS — M79605 Pain in left leg: Secondary | ICD-10-CM | POA: Diagnosis not present

## 2022-11-14 DIAGNOSIS — E119 Type 2 diabetes mellitus without complications: Secondary | ICD-10-CM | POA: Diagnosis not present

## 2022-11-14 DIAGNOSIS — I10 Essential (primary) hypertension: Secondary | ICD-10-CM | POA: Diagnosis not present

## 2022-11-14 DIAGNOSIS — M1612 Unilateral primary osteoarthritis, left hip: Secondary | ICD-10-CM | POA: Diagnosis not present

## 2022-12-02 ENCOUNTER — Other Ambulatory Visit: Payer: Self-pay

## 2022-12-02 DIAGNOSIS — I2585 Chronic coronary microvascular dysfunction: Secondary | ICD-10-CM | POA: Insufficient documentation

## 2022-12-02 DIAGNOSIS — I10 Essential (primary) hypertension: Secondary | ICD-10-CM | POA: Insufficient documentation

## 2022-12-02 DIAGNOSIS — E119 Type 2 diabetes mellitus without complications: Secondary | ICD-10-CM | POA: Insufficient documentation

## 2022-12-02 DIAGNOSIS — Z8679 Personal history of other diseases of the circulatory system: Secondary | ICD-10-CM | POA: Insufficient documentation

## 2022-12-02 DIAGNOSIS — G8929 Other chronic pain: Secondary | ICD-10-CM | POA: Diagnosis not present

## 2022-12-03 NOTE — Progress Notes (Unsigned)
Cardiology Office Note:    Date:  12/04/2022   ID:  Clarence Larson, DOB 1965/03/12, MRN [id number]  PCP:  Audie Pinto, FNP  Cardiologist:  Norman Herrlich, MD    Referring MD: Audie Pinto, FNP    ASSESSMENT:    1. Hypertensive heart disease with heart failure (HCC)   2. Mixed hyperlipidemia   3. Type 2 diabetes mellitus without complication, without long-term current use of insulin (HCC)   4. Coronary artery calcification seen on CT scan    PLAN:    In order of problems listed above:  Improved normalized EF New York Heart Association class I continue his current drug regimen with normalization of ejection fraction Check BMP proBNP today Continue current lipid-lowering treatment rosuvastatin along with clopidogrel with history of stroke and coronary artery calcification   Next appointment: 6 months   Medication Adjustments/Labs and Tests Ordered: Current medicines are reviewed at length with the patient today.  Concerns regarding medicines are outlined above.  Orders Placed This Encounter  Procedures   Basic Metabolic Panel (BMET)   Pro b natriuretic peptide (BNP)   No orders of the defined types were placed in this encounter.    History of Present Illness:    Clarence Larson is a 57 y.o. male with a hx of asthma obstructive sleep apnea failure with mildly reduced ejection fraction December 2000 2340 to 45% coronary artery calcification hypertensive heart disease type 2 diabetes and hyper lipidemia last seen 09/08/2022 for evaluation of shortness of breath and suspected diastolic heart failure.  On the visit he had an echocardiogram reported 11/05/2022 left ventricle normal in size mild concentric LVH normal systolic function EF calculated 64% diastolic function was normal right ventricle normal size and function and no valvular abnormality.  proBNP level ordered but not reported  Compliance with diet, lifestyle and medications: Yes  He is seen back in  follow-up and has done well and in fact has had no further cardiovascular symptoms of shortness of breath exercise intolerance edema palpitation chest pain or syncope I reviewed his echocardiogram with normal left ventricular systolic function. Discussed heart failure patient at this time he prefers not to have an ischemic eval ration I think that is reasonable He will continue his current medical regimen including his loop diuretic SGLT2 inhibitor beta-blocker and ARB. Okay to draw the labs he was to have done last visit BMP proBNP. Past Medical History:  Diagnosis Date   Abnormal liver enzymes 01/22/2022   Formatting of this note might be different from the original. Noted 11/2021     Anxiety 01/22/2022   Atherosclerosis of both carotid arteries 01/22/2022   Formatting of this note might be different from the original. 30% [facility] 11/2021 Formatting of this note might be different from the original. 30% RH 11/2021     CAD (coronary artery disease) 02/19/2022   Chronic coronary microvascular dysfunction    Chronic pain syndrome 11/22/2021   Diabetes mellitus with hyperglycemia (HCC) 07/15/2022   Essential hypertension 07/31/2021   Gastroesophageal reflux disease without esophagitis 07/31/2021   History of CHF (congestive heart failure)    Hypertension    Mild persistent asthma without complication 07/31/2021   Mixed hyperlipidemia 07/31/2021   TIA (transient ischemic attack)    Type 2 diabetes mellitus (HCC)    Type 2 diabetes mellitus without complication, without long-term current use of insulin (HCC) 07/31/2021    Current Medications: Current Meds  Medication Sig   albuterol (VENTOLIN HFA) 108 (90 Base)  Cardiology Office Note:    Date:  12/04/2022   ID:  Clarence Larson, DOB 1965/03/12, MRN [id number]  PCP:  Audie Pinto, FNP  Cardiologist:  Norman Herrlich, MD    Referring MD: Audie Pinto, FNP    ASSESSMENT:    1. Hypertensive heart disease with heart failure (HCC)   2. Mixed hyperlipidemia   3. Type 2 diabetes mellitus without complication, without long-term current use of insulin (HCC)   4. Coronary artery calcification seen on CT scan    PLAN:    In order of problems listed above:  Improved normalized EF New York Heart Association class I continue his current drug regimen with normalization of ejection fraction Check BMP proBNP today Continue current lipid-lowering treatment rosuvastatin along with clopidogrel with history of stroke and coronary artery calcification   Next appointment: 6 months   Medication Adjustments/Labs and Tests Ordered: Current medicines are reviewed at length with the patient today.  Concerns regarding medicines are outlined above.  Orders Placed This Encounter  Procedures   Basic Metabolic Panel (BMET)   Pro b natriuretic peptide (BNP)   No orders of the defined types were placed in this encounter.    History of Present Illness:    Clarence Larson is a 57 y.o. male with a hx of asthma obstructive sleep apnea failure with mildly reduced ejection fraction December 2000 2340 to 45% coronary artery calcification hypertensive heart disease type 2 diabetes and hyper lipidemia last seen 09/08/2022 for evaluation of shortness of breath and suspected diastolic heart failure.  On the visit he had an echocardiogram reported 11/05/2022 left ventricle normal in size mild concentric LVH normal systolic function EF calculated 64% diastolic function was normal right ventricle normal size and function and no valvular abnormality.  proBNP level ordered but not reported  Compliance with diet, lifestyle and medications: Yes  He is seen back in  follow-up and has done well and in fact has had no further cardiovascular symptoms of shortness of breath exercise intolerance edema palpitation chest pain or syncope I reviewed his echocardiogram with normal left ventricular systolic function. Discussed heart failure patient at this time he prefers not to have an ischemic eval ration I think that is reasonable He will continue his current medical regimen including his loop diuretic SGLT2 inhibitor beta-blocker and ARB. Okay to draw the labs he was to have done last visit BMP proBNP. Past Medical History:  Diagnosis Date   Abnormal liver enzymes 01/22/2022   Formatting of this note might be different from the original. Noted 11/2021     Anxiety 01/22/2022   Atherosclerosis of both carotid arteries 01/22/2022   Formatting of this note might be different from the original. 30% [facility] 11/2021 Formatting of this note might be different from the original. 30% [facility] 11/2021     CAD (coronary artery disease) 02/19/2022   Chronic coronary microvascular dysfunction    Chronic pain syndrome 11/22/2021   Diabetes mellitus with hyperglycemia (HCC) 07/15/2022   Essential hypertension 07/31/2021   Gastroesophageal reflux disease without esophagitis 07/31/2021   History of CHF (congestive heart failure)    Hypertension    Mild persistent asthma without complication 07/31/2021   Mixed hyperlipidemia 07/31/2021   TIA (transient ischemic attack)    Type 2 diabetes mellitus (HCC)    Type 2 diabetes mellitus without complication, without long-term current use of insulin (HCC) 07/31/2021    Current Medications: Current Meds  Medication Sig   albuterol (VENTOLIN HFA) 108 (90 Base)  Cardiology Office Note:    Date:  12/04/2022   ID:  Clarence Larson, DOB 1965/03/12, MRN [id number]  PCP:  Audie Pinto, FNP  Cardiologist:  Norman Herrlich, MD    Referring MD: Audie Pinto, FNP    ASSESSMENT:    1. Hypertensive heart disease with heart failure (HCC)   2. Mixed hyperlipidemia   3. Type 2 diabetes mellitus without complication, without long-term current use of insulin (HCC)   4. Coronary artery calcification seen on CT scan    PLAN:    In order of problems listed above:  Improved normalized EF New York Heart Association class I continue his current drug regimen with normalization of ejection fraction Check BMP proBNP today Continue current lipid-lowering treatment rosuvastatin along with clopidogrel with history of stroke and coronary artery calcification   Next appointment: 6 months   Medication Adjustments/Labs and Tests Ordered: Current medicines are reviewed at length with the patient today.  Concerns regarding medicines are outlined above.  Orders Placed This Encounter  Procedures   Basic Metabolic Panel (BMET)   Pro b natriuretic peptide (BNP)   No orders of the defined types were placed in this encounter.    History of Present Illness:    Clarence Larson is a 57 y.o. male with a hx of asthma obstructive sleep apnea failure with mildly reduced ejection fraction December 2000 2340 to 45% coronary artery calcification hypertensive heart disease type 2 diabetes and hyper lipidemia last seen 09/08/2022 for evaluation of shortness of breath and suspected diastolic heart failure.  On the visit he had an echocardiogram reported 11/05/2022 left ventricle normal in size mild concentric LVH normal systolic function EF calculated 64% diastolic function was normal right ventricle normal size and function and no valvular abnormality.  proBNP level ordered but not reported  Compliance with diet, lifestyle and medications: Yes  He is seen back in  follow-up and has done well and in fact has had no further cardiovascular symptoms of shortness of breath exercise intolerance edema palpitation chest pain or syncope I reviewed his echocardiogram with normal left ventricular systolic function. Discussed heart failure patient at this time he prefers not to have an ischemic eval ration I think that is reasonable He will continue his current medical regimen including his loop diuretic SGLT2 inhibitor beta-blocker and ARB. Okay to draw the labs he was to have done last visit BMP proBNP. Past Medical History:  Diagnosis Date   Abnormal liver enzymes 01/22/2022   Formatting of this note might be different from the original. Noted 11/2021     Anxiety 01/22/2022   Atherosclerosis of both carotid arteries 01/22/2022   Formatting of this note might be different from the original. 30% [facility] 11/2021 Formatting of this note might be different from the original. 30% [facility] 11/2021     CAD (coronary artery disease) 02/19/2022   Chronic coronary microvascular dysfunction    Chronic pain syndrome 11/22/2021   Diabetes mellitus with hyperglycemia (HCC) 07/15/2022   Essential hypertension 07/31/2021   Gastroesophageal reflux disease without esophagitis 07/31/2021   History of CHF (congestive heart failure)    Hypertension    Mild persistent asthma without complication 07/31/2021   Mixed hyperlipidemia 07/31/2021   TIA (transient ischemic attack)    Type 2 diabetes mellitus (HCC)    Type 2 diabetes mellitus without complication, without long-term current use of insulin (HCC) 07/31/2021    Current Medications: Current Meds  Medication Sig   albuterol (VENTOLIN HFA) 108 (90 Base)  MCG/ACT inhaler Inhale 2 puffs into the lungs every 6 (six) hours as needed for wheezing or shortness of breath.   Budeson-Glycopyrrol-Formoterol (BREZTRI AEROSPHERE) 160-9-4.8 MCG/ACT AERO Inhale 2 puffs into the lungs 2 (two) times daily.   clopidogrel (PLAVIX) 75 MG tablet Take 75 mg  by mouth daily.   dapagliflozin propanediol (FARXIGA) 10 MG TABS tablet Take 1 tablet (10 mg total) by mouth daily before breakfast.   famotidine (PEPCID) 20 MG tablet Take 20 mg by mouth daily.   fluticasone (FLONASE) 50 MCG/ACT nasal spray Place 2 sprays into both nostrils daily.   furosemide (LASIX) 40 MG tablet Take 1 tablet (40 mg total) by mouth daily.   hydrOXYzine (ATARAX) 25 MG tablet Take 25 mg by mouth 2 (two) times daily.   ipratropium-albuterol (DUONEB) 0.5-2.5 (3) MG/3ML SOLN Take 3 mLs by nebulization every 6 (six) hours as needed (wheezing or shortness of breath).   loratadine (CLARITIN) 10 MG tablet Take 10 mg by mouth daily.   losartan (COZAAR) 50 MG tablet Take 50 mg by mouth daily.   metformin (FORTAMET) 500 MG (OSM) 24 hr tablet Take 500 mg by mouth 2 (two) times daily with a meal.   montelukast (SINGULAIR) 10 MG tablet Take 10 mg by mouth at bedtime.   Multiple Vitamin (MULTIVITAMIN) capsule Take 1 capsule by mouth daily.   nebivolol (BYSTOLIC) 10 MG tablet Take 10 mg by mouth daily.   oxycodone (ROXICODONE) 30 MG immediate release tablet Take 30 mg by mouth every 4 (four) hours as needed for pain.   pantoprazole (PROTONIX) 40 MG tablet Take 40 mg by mouth daily.   rosuvastatin (CRESTOR) 10 MG tablet Take 10 mg by mouth daily.   Semaglutide, 1 MG/DOSE, 4 MG/3ML SOPN Inject 1 mg into the skin every 7 (seven) days.   tadalafil (CIALIS) 20 MG tablet Take 20 mg by mouth daily as needed for erectile dysfunction.   testosterone cypionate (DEPOTESTOSTERONE CYPIONATE) 200 MG/ML injection Inject 200 mg into the muscle once a week.      EKGs/Labs/Other Studies Reviewed:    The following studies were reviewed today:  Cardiac Studies & Procedures       ECHOCARDIOGRAM  ECHOCARDIOGRAM COMPLETE 11/05/2022  Narrative ECHOCARDIOGRAM REPORT    Patient Name:   Clarence Larson Date of Exam: 11/05/2022 Medical Rec #:  [id number]         Height:       71.0 in Accession #:     [id number]        Weight:       260.0 lb Date of Birth:  August 17, 1965         BSA:          2.357 m Patient Age:    57 years          BP:           140/84 mmHg Patient Gender: M                 HR:           89 bpm. [facility]  Procedure: 2D Echo, Cardiac Doppler and Color Doppler  Indications:    R06.02 SOB; I50.42 Chronic combined systolic (congestive) and diastolic (congestive) heart failure  History:        Patient has prior history of Echocardiogram examinations, most recent 11/29/2021. CHF, CAD, Carotid Disease and TIA, Signs/Symptoms:Shortness of Breath; Risk Factors:Hypertension, Diabetes, Dyslipidemia and Non-Smoker.  Sonographer:    [employer] BS, RVT,  MCG/ACT inhaler Inhale 2 puffs into the lungs every 6 (six) hours as needed for wheezing or shortness of breath.   Budeson-Glycopyrrol-Formoterol (BREZTRI AEROSPHERE) 160-9-4.8 MCG/ACT AERO Inhale 2 puffs into the lungs 2 (two) times daily.   clopidogrel (PLAVIX) 75 MG tablet Take 75 mg  by mouth daily.   dapagliflozin propanediol (FARXIGA) 10 MG TABS tablet Take 1 tablet (10 mg total) by mouth daily before breakfast.   famotidine (PEPCID) 20 MG tablet Take 20 mg by mouth daily.   fluticasone (FLONASE) 50 MCG/ACT nasal spray Place 2 sprays into both nostrils daily.   furosemide (LASIX) 40 MG tablet Take 1 tablet (40 mg total) by mouth daily.   hydrOXYzine (ATARAX) 25 MG tablet Take 25 mg by mouth 2 (two) times daily.   ipratropium-albuterol (DUONEB) 0.5-2.5 (3) MG/3ML SOLN Take 3 mLs by nebulization every 6 (six) hours as needed (wheezing or shortness of breath).   loratadine (CLARITIN) 10 MG tablet Take 10 mg by mouth daily.   losartan (COZAAR) 50 MG tablet Take 50 mg by mouth daily.   metformin (FORTAMET) 500 MG (OSM) 24 hr tablet Take 500 mg by mouth 2 (two) times daily with a meal.   montelukast (SINGULAIR) 10 MG tablet Take 10 mg by mouth at bedtime.   Multiple Vitamin (MULTIVITAMIN) capsule Take 1 capsule by mouth daily.   nebivolol (BYSTOLIC) 10 MG tablet Take 10 mg by mouth daily.   oxycodone (ROXICODONE) 30 MG immediate release tablet Take 30 mg by mouth every 4 (four) hours as needed for pain.   pantoprazole (PROTONIX) 40 MG tablet Take 40 mg by mouth daily.   rosuvastatin (CRESTOR) 10 MG tablet Take 10 mg by mouth daily.   Semaglutide, 1 MG/DOSE, 4 MG/3ML SOPN Inject 1 mg into the skin every 7 (seven) days.   tadalafil (CIALIS) 20 MG tablet Take 20 mg by mouth daily as needed for erectile dysfunction.   testosterone cypionate (DEPOTESTOSTERONE CYPIONATE) 200 MG/ML injection Inject 200 mg into the muscle once a week.      EKGs/Labs/Other Studies Reviewed:    The following studies were reviewed today:  Cardiac Studies & Procedures       ECHOCARDIOGRAM  ECHOCARDIOGRAM COMPLETE 11/05/2022  Narrative ECHOCARDIOGRAM REPORT    Patient Name:   Clarence Larson Date of Exam: 11/05/2022 Medical Rec #:  [id number]         Height:       71.0 in Accession #:     [id number]        Weight:       260.0 lb Date of Birth:  August 17, 1965         BSA:          2.357 m Patient Age:    57 years          BP:           140/84 mmHg Patient Gender: M                 HR:           89 bpm. [facility]  Procedure: 2D Echo, Cardiac Doppler and Color Doppler  Indications:    R06.02 SOB; I50.42 Chronic combined systolic (congestive) and diastolic (congestive) heart failure  History:        Patient has prior history of Echocardiogram examinations, most recent 11/29/2021. CHF, CAD, Carotid Disease and TIA, Signs/Symptoms:Shortness of Breath; Risk Factors:Hypertension, Diabetes, Dyslipidemia and Non-Smoker.  Sonographer:    [employer] BS, RVT,

## 2022-12-04 ENCOUNTER — Ambulatory Visit: Payer: PPO | Attending: Cardiology | Admitting: Cardiology

## 2022-12-04 ENCOUNTER — Encounter: Payer: Self-pay | Admitting: Cardiology

## 2022-12-04 VITALS — BP 110/72 | HR 86 | Ht 71.0 in | Wt 258.4 lb

## 2022-12-04 DIAGNOSIS — I110 Hypertensive heart disease with heart failure: Secondary | ICD-10-CM

## 2022-12-04 DIAGNOSIS — I2510 Atherosclerotic heart disease of native coronary artery without angina pectoris: Secondary | ICD-10-CM | POA: Diagnosis not present

## 2022-12-04 DIAGNOSIS — E782 Mixed hyperlipidemia: Secondary | ICD-10-CM | POA: Diagnosis not present

## 2022-12-04 DIAGNOSIS — E119 Type 2 diabetes mellitus without complications: Secondary | ICD-10-CM | POA: Diagnosis not present

## 2022-12-04 NOTE — Patient Instructions (Signed)
Medication Instructions:  Your physician recommends that you continue on your current medications as directed. Please refer to the Current Medication list given to you today.  *If you need a refill on your cardiac medications before your next appointment, please call your pharmacy*   Lab Work: Your physician recommends that you return for lab work in:   Labs today: BMP, Pro BNP  If you have labs (blood work) drawn today and your tests are completely normal, you will receive your results only by: MyChart Message (if you have MyChart) OR A paper copy in the mail If you have any lab test that is abnormal or we need to change your treatment, we will call you to review the results.   Testing/Procedures: None   Follow-Up: At [facility], you and your health needs are our priority.  As part of our continuing mission to provide you with exceptional heart care, we have created designated Provider Care Teams.  These Care Teams include your primary Cardiologist (physician) and Advanced Practice Providers (APPs -  Physician Assistants and Nurse Practitioners) who all work together to provide you with the care you need, when you need it.  We recommend signing up for the patient portal called "MyChart".  Sign up information is provided on this After Visit Summary.  MyChart is used to connect with patients for Virtual Visits (Telemedicine).  Patients are able to view lab/test results, encounter notes, upcoming appointments, etc.  Non-urgent messages can be sent to your provider as well.   To learn more about what you can do with MyChart, go to [email or web address].    Your next appointment:   6 month(s)  Provider:   Wallis Bamberg, NP [facility])    Other Instructions None

## 2022-12-05 DIAGNOSIS — C44619 Basal cell carcinoma of skin of left upper limb, including shoulder: Secondary | ICD-10-CM | POA: Diagnosis not present

## 2023-01-12 DIAGNOSIS — K219 Gastro-esophageal reflux disease without esophagitis: Secondary | ICD-10-CM | POA: Diagnosis not present

## 2023-01-12 DIAGNOSIS — Z8673 Personal history of transient ischemic attack (TIA), and cerebral infarction without residual deficits: Secondary | ICD-10-CM | POA: Diagnosis not present

## 2023-01-12 DIAGNOSIS — R5383 Other fatigue: Secondary | ICD-10-CM | POA: Diagnosis not present

## 2023-01-12 DIAGNOSIS — J4530 Mild persistent asthma, uncomplicated: Secondary | ICD-10-CM | POA: Diagnosis not present

## 2023-01-12 DIAGNOSIS — G894 Chronic pain syndrome: Secondary | ICD-10-CM | POA: Diagnosis not present

## 2023-01-12 DIAGNOSIS — I2585 Chronic coronary microvascular dysfunction: Secondary | ICD-10-CM | POA: Diagnosis not present

## 2023-01-12 DIAGNOSIS — Z6836 Body mass index (BMI) 36.0-36.9, adult: Secondary | ICD-10-CM | POA: Diagnosis not present

## 2023-01-12 DIAGNOSIS — D500 Iron deficiency anemia secondary to blood loss (chronic): Secondary | ICD-10-CM | POA: Diagnosis not present

## 2023-01-12 DIAGNOSIS — E1165 Type 2 diabetes mellitus with hyperglycemia: Secondary | ICD-10-CM | POA: Diagnosis not present

## 2023-01-12 DIAGNOSIS — D649 Anemia, unspecified: Secondary | ICD-10-CM | POA: Diagnosis not present

## 2023-01-12 DIAGNOSIS — I10 Essential (primary) hypertension: Secondary | ICD-10-CM | POA: Diagnosis not present

## 2023-01-12 DIAGNOSIS — R5381 Other malaise: Secondary | ICD-10-CM | POA: Diagnosis not present

## 2023-01-12 DIAGNOSIS — F419 Anxiety disorder, unspecified: Secondary | ICD-10-CM | POA: Diagnosis not present

## 2023-01-12 DIAGNOSIS — E782 Mixed hyperlipidemia: Secondary | ICD-10-CM | POA: Diagnosis not present

## 2023-01-18 DIAGNOSIS — M25512 Pain in left shoulder: Secondary | ICD-10-CM | POA: Diagnosis not present

## 2023-01-18 DIAGNOSIS — M542 Cervicalgia: Secondary | ICD-10-CM | POA: Diagnosis not present

## 2023-01-18 DIAGNOSIS — G894 Chronic pain syndrome: Secondary | ICD-10-CM | POA: Diagnosis not present

## 2023-01-28 DIAGNOSIS — M25561 Pain in right knee: Secondary | ICD-10-CM | POA: Diagnosis not present

## 2023-04-15 ENCOUNTER — Other Ambulatory Visit: Payer: Self-pay | Admitting: Specialist

## 2023-04-15 DIAGNOSIS — M25561 Pain in right knee: Secondary | ICD-10-CM

## 2023-05-04 ENCOUNTER — Other Ambulatory Visit

## 2023-05-07 ENCOUNTER — Telehealth: Payer: Self-pay

## 2023-05-07 NOTE — Telephone Encounter (Signed)
   Pre-operative Risk Assessment    Patient Name: Clarence Larson  DOB: 06-30-1965 MRN: [id number]  Date of last office visit: 12/04/2022 Date of next office visit: 06/12/2023   Request for Surgical Clearance    Procedure:   Colonoscopy  Date of Surgery:  Clearance 06/26/23                              Surgeon:  Not indicated  Surgeon's Group or Practice Name:  [facility]  Phone number:  [phone] Fax number:  [phone] Type of Clearance Requested:   - Medical  - Pharmacy:  Hold Aspirin and Clopidogrel (Plavix) 5 days prior Type of Anesthesia:  Not Indicated   Additional requests/questions:    Signed, [place] C Ilah Boule   05/07/2023, 11:39 AM

## 2023-05-07 NOTE — Telephone Encounter (Signed)
 Pt is scheduled to see Dr. Dulce Sellar 06/12/23 , clearance will be addressed at that time.  Will route to the requesting surgeon's office to make them aware.

## 2023-05-07 NOTE — Telephone Encounter (Signed)
   Name: Clarence Larson  DOB: [birth year]  MRN: [id number]  Primary Cardiologist: None  Chart reviewed as part of pre-operative protocol coverage. Because of Clarence Larson's past medical history and time since last visit, he will require a follow-up in-office visit in order to better assess preoperative cardiovascular risk.  Pre-op covering staff: - Please schedule appointment and call patient to inform them. If patient already had an upcoming appointment within acceptable timeframe, please add "pre-op clearance" to the appointment notes so provider is aware. - Please contact requesting surgeon's office via preferred method (i.e, phone, fax) to inform them of need for appointment prior to surgery.  Patient has a history of nonobstructive CAD and is on Plavix and aspirin.  Also has history of TIA.  From a cardiac standpoint can hold Plavix x 5 to 7 days prior to procedure.  Would prefer aspirin to be continued throughout.  Would also get clearance from neuro given TIA.  Sharlene Dory, PA-C  05/07/2023, 11:51 AM

## 2023-05-12 ENCOUNTER — Ambulatory Visit
Admission: RE | Admit: 2023-05-12 | Discharge: 2023-05-12 | Disposition: A | Discharge status: Home / Self Care | Source: Ambulatory Visit | Attending: Specialist | Admitting: Specialist

## 2023-05-12 DIAGNOSIS — M25561 Pain in right knee: Secondary | ICD-10-CM

## 2023-05-17 DIAGNOSIS — I517 Cardiomegaly: Secondary | ICD-10-CM | POA: Diagnosis not present

## 2023-06-12 ENCOUNTER — Other Ambulatory Visit: Payer: Self-pay

## 2023-06-12 ENCOUNTER — Ambulatory Visit: Admitting: Cardiology

## 2023-06-15 ENCOUNTER — Ambulatory Visit

## 2023-06-17 ENCOUNTER — Ambulatory Visit

## 2023-06-17 VITALS — BP 120/70 | HR 92 | Ht 71.0 in | Wt 248.0 lb

## 2023-06-17 DIAGNOSIS — I2510 Atherosclerotic heart disease of native coronary artery without angina pectoris: Secondary | ICD-10-CM

## 2023-06-17 DIAGNOSIS — I493 Ventricular premature depolarization: Secondary | ICD-10-CM

## 2023-06-17 DIAGNOSIS — I509 Heart failure, unspecified: Secondary | ICD-10-CM

## 2023-06-17 DIAGNOSIS — Z01810 Encounter for preprocedural cardiovascular examination: Secondary | ICD-10-CM | POA: Insufficient documentation

## 2023-06-17 DIAGNOSIS — I5032 Chronic diastolic (congestive) heart failure: Secondary | ICD-10-CM

## 2023-06-17 DIAGNOSIS — I110 Hypertensive heart disease with heart failure: Secondary | ICD-10-CM | POA: Insufficient documentation

## 2023-06-17 HISTORY — DX: Heart failure, unspecified: I50.9

## 2023-06-17 HISTORY — DX: Encounter for preprocedural cardiovascular examination: Z01.810

## 2023-06-17 HISTORY — DX: Ventricular premature depolarization: I49.3

## 2023-06-17 HISTORY — DX: Hypertensive heart disease with heart failure: I11.0

## 2023-06-17 NOTE — Assessment & Plan Note (Signed)
 Well-controlled. Target below 130/80 mmHg. Continue current medications Bystolic [Nebivolol] and valsartan  as reviewed above.

## 2023-06-17 NOTE — Assessment & Plan Note (Signed)
 Frequent PVCs in a bigeminy pattern noted at this time on EKG. He appears to be asymptomatic. Mentions he has had frequent PVCs in the past.  Will obtain Zio patch monitor to assess overall burden of PVCs and any other arrhythmias.  Continue Nebivolol 10 mg once daily.

## 2023-06-17 NOTE — Assessment & Plan Note (Signed)
 Coronary atherosclerosis noted on prior imaging studies. Last stress test with nuclear imaging Lexiscan  January 2019 at [facility] negative for ischemia.  No prior history of MI or revascularization.  No symptoms of chest pain. Good functional status at baseline.  Remains on aspirin 81 mg once daily and clopidogrel 75 mg once daily for his history of TIA.  From cardiac standpoint okay to hold antiplatelet therapy as needed for his colonoscopy.  Remains on lipid-lowering therapy with atorvastatin 10 mg once daily which she has been tolerating well.

## 2023-06-17 NOTE — Assessment & Plan Note (Addendum)
 Scheduled for colonoscopy next week. From cardiac standpoint he has not had any prior MI.  He has coronary atherosclerosis. He has recovered LVEF. Clinically not showing any signs or symptoms of heart failure, does not have angina, no concerns for acute coronary syndrome at this time.  From cardiac standpoint okay to proceed with his procedure as being planned as a low risk procedure from cardiac standpoint.  With regards to antiplatelet therapy he is on aspirin and clopidogrel in the setting of prior reported history of TIA. From cardiac standpoint okay to hold these medications as needed for the procedure.  Please review with and/or neurologist with regards to holding antiplatelets for the procedure.

## 2023-06-17 NOTE — Assessment & Plan Note (Addendum)
 Appears euvolemic. Compensated. NYHA class I. Recovered LVEF. [EF reportedly 40 to 45% in December 2023 by echocardiogram at [facility], subsequently normalized with recent EF from April 2025 at [facility] 60 to 65%].  Continue with salt restriction to below 2 g/day. Continue with furosemide  40 mg on an as-needed basis for weight gain over 2 to 3 pounds within a day or 4 to 5 pounds within a week.  Remains on guideline directed medical therapy at this time with - Nebivolol 10 mg once daily - Valsartan  80 mg twice daily - Farxiga  10 mg once daily

## 2023-06-17 NOTE — Patient Instructions (Signed)
Medication Instructions:  Your physician recommends that you continue on your current medications as directed. Please refer to the Current Medication list given to you today.  *If you need a refill on your cardiac medications before your next appointment, please call your pharmacy*   Lab Work: None ordered If you have labs (blood work) drawn today and your tests are completely normal, you will receive your results only by: MyChart Message (if you have MyChart) OR A paper copy in the mail If you have any lab test that is abnormal or we need to change your treatment, we will call you to review the results.   Testing/Procedures: None ordered   Follow-Up: At [facility], you and your health needs are our priority.  As part of our continuing mission to provide you with exceptional heart care, we have created designated Provider Care Teams.  These Care Teams include your primary Cardiologist (physician) and Advanced Practice Providers (APPs -  Physician Assistants and Nurse Practitioners) who all work together to provide you with the care you need, when you need it.  We recommend signing up for the patient portal called "MyChart".  Sign up information is provided on this After Visit Summary.  MyChart is used to connect with patients for Virtual Visits (Telemedicine).  Patients are able to view lab/test results, encounter notes, upcoming appointments, etc.  Non-urgent messages can be sent to your provider as well.   To learn more about what you can do with MyChart, go to [email or web address].com.    Your next appointment:   3 month(s)  The format for your next appointment:   In Person  Provider:   Rajan Revankar, MD    Other Instructions none  Important Information About Sugar      

## 2023-06-17 NOTE — Progress Notes (Signed)
 Cardiology Consultation:    Date:  06/17/2023   ID:  Clarence Larson, DOB 09-May-1965, MRN [id number]  PCP:  Clarence Alter, FNP  Cardiologist:  Clarence Evans Ade Stmarie, MD   Referring MD: Clarence Alter, FNP   Chief Complaint  Patient presents with   Pre-op Exam   Follow-up     ASSESSMENT AND PLAN:   Clarence Larson 58 year old male with history of coronary atherosclerosis, CHF with mildly reduced EF 40 to 45% December 2023 by echocardiogram [at [facility]] subsequently normalized with LVEF 64% on echocardiogram September 2024 and most recent echocardiogram May 17, 2023 at [facility] EF 60 to 65%, hypertension, diabetes mellitus type 2, hyperlipidemia, obstructive sleep apnea, TIA, erectile dysfunction, GERD. Here for cardiac exam prior to colonoscopy scheduled. EKG incidentally notes frequent PVCs in a bigeminy pattern.  Problem List Items Addressed This Visit     CAD (coronary artery disease)   Coronary atherosclerosis noted on prior imaging studies. Last stress test with nuclear imaging Lexiscan  January 2019 at [facility] negative for ischemia.  No prior history of MI or revascularization.  No symptoms of chest pain. Good functional status at baseline.  Remains on aspirin 81 mg once daily and clopidogrel 75 mg once daily for his history of TIA.  From cardiac standpoint okay to hold antiplatelet therapy as needed for his colonoscopy.  Remains on lipid-lowering therapy with atorvastatin 10 mg once daily which she has been tolerating well.        Relevant Medications   atorvastatin (LIPITOR) 10 MG tablet   Preop cardiovascular exam - Primary   Scheduled for colonoscopy next week. From cardiac standpoint he has not had any prior MI.  He has coronary atherosclerosis. He has recovered LVEF. Clinically not showing any signs or symptoms of heart failure, does not have angina, no concerns for acute coronary syndrome at this time.  From cardiac  standpoint okay to proceed with his procedure as being planned as a low risk procedure from cardiac standpoint.  With regards to antiplatelet therapy he is on aspirin and clopidogrel in the setting of prior reported history of TIA. From cardiac standpoint okay to hold these medications as needed for the procedure.  Please review with and/or neurologist with regards to holding antiplatelets for the procedure.      Frequent PVCs   Frequent PVCs in a bigeminy pattern noted at this time on EKG. He appears to be asymptomatic. Mentions he has had frequent PVCs in the past.  Will obtain Zio patch monitor to assess overall burden of PVCs and any other arrhythmias.  Continue Nebivolol 10 mg once daily.       Relevant Medications   atorvastatin (LIPITOR) 10 MG tablet   Other Relevant Orders   LONG TERM MONITOR (3-14 DAYS)   Hypertensive heart disease with heart failure (HCC)   Well-controlled. Target below 130/80 mmHg. Continue current medications Bystolic [Nebivolol] and valsartan  as reviewed above.       Relevant Medications   atorvastatin (LIPITOR) 10 MG tablet   Other Relevant Orders   EKG 12-Lead (Completed)   CHF (congestive heart failure) (HCC)   Appears euvolemic. Compensated. NYHA class I. Recovered LVEF. [EF reportedly 40 to 45% in December 2023 by echocardiogram at [facility], subsequently normalized with recent EF from April 2025 at [facility] 60 to 65%].  Continue with salt restriction to below 2 g/day. Continue with furosemide  40 mg on an as-needed basis for weight gain over 2 to  3 pounds within a day or 4 to 5 pounds within a week.  Remains on guideline directed medical therapy at this time with - Nebivolol 10 mg once daily - Valsartan  80 mg twice daily - Farxiga  10 mg once daily      Relevant Medications   atorvastatin (LIPITOR) 10 MG tablet    Return to clinic tentatively in 2 to 3 months.  History of Present Illness:    Clarence Larson is a 58 y.o. male who is being seen today for follow-up visit and preop cardiovascular risk assessment prior to elective colonoscopy tentatively scheduled for Jun 26, 2022 at [facility] and requesting to hold aspirin and clopidogrel 5 days prior to the procedure.   PCP is [name], [name], FNP. Last visit in our office was 12/04/2022 with Clarence Larson.  Coronary atherosclerosis (pharmacological stress test Lexiscan  nuclear med Theodis Fiscal 03/07/2017 noted no ischemia], CHF with mildly reduced EF 40 to 45% December 2023 by echocardiogram [at [facility]] subsequently normalized with LVEF 64% on echocardiogram September 2024 and most recent echocardiogram May 17, 2023 at [facility] EF 60 to 65%, hypertension, diabetes mellitus type 2, hyperlipidemia, obstructive sleep apnea, TIA, erectile dysfunction, GERD.  Denies any chest pain or shortness of breath. Denies any palpitations. Denies any sensation of skipped beat or extra beat. Denies any orthopnea. Denies any pedal edema.  Uses furosemide  on an as-needed basis. Denies any significant weight changes.  Mentions he remains on both aspirin 81 mg once daily and Plavix 75 mg once daily. Had been restarted on atorvastatin 10 mg once daily by PCP after it was interrupted for concerns about joint aches but later this was attributed to degenerative joint disease.  EKG in the clinic today shows sinus rhythm heart rate 92/min with frequent PVCs in a bigeminy pattern.  PVCs monomorphic appear to be of RVOT origin.    Echocardiogram from 05/17/2023 at [facility] reported LVEF 60 to 65%, mild LVH, mildly dilated RV size, normal RV function, trace mitral regurgitation, trace tricuspid regurgitation  Last stress test with nuclear imaging was from March 07, 2017 at [facility] that reported no ischemia.  Last lipid panel from 01/12/2023 total cholesterol 123, triglycerides 197, HDL 33, LDL 63 Hemoglobin Z6X  6.8  Past Medical History:  Diagnosis Date   Abnormal liver enzymes 01/22/2022   Formatting of this note might be different from the original. Noted 11/2021     Anxiety 01/22/2022   Atherosclerosis of both carotid arteries 01/22/2022   Formatting of this note might be different from the original. 30% RH 11/2021 Formatting of this note might be different from the original. 30% RH 11/2021     CAD (coronary artery disease) 02/19/2022   Chronic coronary microvascular dysfunction    Chronic pain syndrome 11/22/2021   Diabetes mellitus with hyperglycemia (HCC) 07/15/2022   Essential hypertension 07/31/2021   Gastroesophageal reflux disease without esophagitis 07/31/2021   History of CHF (congestive heart failure)    Hypertension    Mild persistent asthma without complication 07/31/2021   Mixed hyperlipidemia 07/31/2021   TIA (transient ischemic attack)    Type 2 diabetes mellitus (HCC)    Type 2 diabetes mellitus without complication, without long-term current use of insulin  (HCC) 07/31/2021    Past Surgical History:  Procedure Laterality Date   ARTHROPLASTY     BRAIN SURGERY     NECK SURGERY     ROTATOR CUFF REPAIR      Current Medications: Current  Meds  Medication Sig   albuterol  (VENTOLIN  HFA) 108 (90 Base) MCG/ACT inhaler Inhale 2 puffs into the lungs every 6 (six) hours as needed for wheezing or shortness of breath.   atorvastatin (LIPITOR) 10 MG tablet Take 1 tablet by mouth at bedtime.   Budeson-Glycopyrrol-Formoterol (BREZTRI AEROSPHERE) 160-9-4.8 MCG/ACT AERO Inhale 2 puffs into the lungs 2 (two) times daily.   clopidogrel (PLAVIX) 75 MG tablet Take 75 mg by mouth daily.   dapagliflozin  propanediol (FARXIGA ) 10 MG TABS tablet Take 1 tablet (10 mg total) by mouth daily before breakfast.   famotidine (PEPCID) 20 MG tablet Take 20 mg by mouth daily.   fluticasone (FLONASE) 50 MCG/ACT nasal spray Place 2 sprays into both nostrils daily.   furosemide  (LASIX ) 40 MG tablet Take  1 tablet (40 mg total) by mouth daily.   hydrOXYzine (ATARAX) 25 MG tablet Take 25 mg by mouth 2 (two) times daily.   ipratropium-albuterol  (DUONEB) 0.5-2.5 (3) MG/3ML SOLN Take 3 mLs by nebulization every 6 (six) hours as needed (wheezing or shortness of breath).   loratadine (CLARITIN) 10 MG tablet Take 10 mg by mouth daily.   metformin (FORTAMET) 500 MG (OSM) 24 hr tablet Take 500 mg by mouth 2 (two) times daily with a meal.   montelukast (SINGULAIR) 10 MG tablet Take 10 mg by mouth at bedtime.   Multiple Vitamin (MULTIVITAMIN) capsule Take 1 capsule by mouth daily.   nebivolol (BYSTOLIC) 10 MG tablet Take 10 mg by mouth daily.   oxycodone  (ROXICODONE ) 30 MG immediate release tablet Take 30 mg by mouth every 4 (four) hours as needed for pain.   OZEMPIC, 2 MG/DOSE, 8 MG/3ML SOPN Inject 0.75 mg into the skin every 7 (seven) days.   pantoprazole  (PROTONIX ) 40 MG tablet Take 40 mg by mouth daily.   tadalafil (CIALIS) 20 MG tablet Take 20 mg by mouth daily as needed for erectile dysfunction.   testosterone  cypionate (DEPOTESTOSTERONE CYPIONATE) 200 MG/ML injection Inject 200 mg into the muscle once a week.   valsartan  (DIOVAN ) 80 MG tablet Take 1 tablet (80 mg total) by mouth 2 (two) times daily.   [DISCONTINUED] losartan (COZAAR) 50 MG tablet Take 50 mg by mouth daily.     Allergies:   Atomoxetine, Esomeprazole magnesium, Hydrocodone, Nystatin, and Codeine   Social History   Socioeconomic History   Marital status: Single    Spouse name: Not on file   Number of children: Not on file   Years of education: Not on file   Highest education level: Not on file  Occupational History   Not on file  Tobacco Use   Smoking status: Never   Smokeless tobacco: Never  Vaping Use   Vaping status: Never Used  Substance and Sexual Activity   Alcohol  use: Yes    Alcohol /week: 3.0 - 4.0 standard drinks of alcohol     Types: 3 - 4 Cans of beer per week   Drug use: No   Sexual activity: Yes  Other  Topics Concern   Not on file  Social History Narrative   Not on file   Social Drivers of Health   Financial Resource Strain: Not on file  Food Insecurity: Low Risk  (06/12/2023)   Received from [facility]   Hunger Vital Sign    Worried About Running Out of Food in the Last Year: Never true    Ran Out of Food in the Last Year: Never true  Transportation Needs: No Transportation Needs (06/12/2023)   Received from  [facility]   Transportation    In the past 12 months, has lack of reliable transportation kept you from medical appointments, meetings, work or from getting things needed for daily living? : No  Physical Activity: Not on file  Stress: Not on file  Social Connections: Unknown (06/21/2021)   Received from [facility]   Social Network    Social Network: Not on file     Family History: The patient's family history includes Alzheimer's disease in his mother; Heart disease in his father; Testicular cancer in his brother. ROS:   Please see the history of present illness.    All 14 point review of systems negative except as described per history of present illness.  EKGs/Labs/Other Studies Reviewed:    The following studies were reviewed today:   EKG:  EKG Interpretation Date/Time:  Wednesday Jun 17 2023 10:31:40 EDT Ventricular Rate:  92 PR Interval:  168 QRS Duration:  102 QT Interval:  374 QTC Calculation: 462 R Axis:   51  Text Interpretation: Sinus rhythm with frequent Premature ventricular complexes in a pattern of bigeminy Otherwise normal ECG When compared with ECG of 08-Sep-2022 10:45, Premature ventricular complexes are now Present Confirmed by Bertha Broad reddy [phone]) on 06/17/2023 11:08:00 AM    Recent Labs: No results found for requested labs within last 365 days.  Recent Lipid Panel No results found for: "CHOL", "TRIG", "HDL", "CHOLHDL", "VLDL", "LDLCALC", "LDLDIRECT"  Physical Exam:    VS:  BP 120/70   Pulse 92   Ht 5\' 11"   (1.803 m)   Wt 248 lb (112.5 kg)   SpO2 94%   BMI 34.59 kg/m     Wt Readings from Last 3 Encounters:  06/17/23 248 lb (112.5 kg)  12/04/22 258 lb 6.4 oz (117.2 kg)  09/08/22 260 lb (117.9 kg)     GENERAL:  Well nourished, well developed in no acute distress NECK: No JVD; No carotid bruits CARDIAC: RRR, S1 and S2 present, no murmurs, no rubs, no gallops CHEST:  Clear to auscultation without rales, wheezing or rhonchi  Extremities: No pitting pedal edema. Pulses bilaterally symmetric with radial 2+ and dorsalis pedis 2+ NEUROLOGIC:  Alert and oriented x 3  Medication Adjustments/Labs and Tests Ordered: Current medicines are reviewed at length with the patient today.  Concerns regarding medicines are outlined above.  Orders Placed This Encounter  Procedures   LONG TERM MONITOR (3-14 DAYS)   EKG 12-Lead   No orders of the defined types were placed in this encounter.   Signed, Lura Sallies, MD, MPH, [facility]. 06/17/2023 1:02 PM    [facility]

## 2023-08-24 ENCOUNTER — Ambulatory Visit: Payer: Self-pay

## 2023-08-24 ENCOUNTER — Telehealth: Payer: Self-pay

## 2023-08-24 DIAGNOSIS — I493 Ventricular premature depolarization: Secondary | ICD-10-CM

## 2023-08-24 NOTE — Telephone Encounter (Signed)
 Patient is calling to follow up on Zio Monitor results from May. Please advise.

## 2023-08-25 NOTE — Telephone Encounter (Signed)
 Left voice message to return call

## 2023-08-25 NOTE — Telephone Encounter (Signed)
 Pt would like to know the cause of the PVC's and if he can do anything to stop them.

## 2023-08-26 NOTE — Telephone Encounter (Signed)
 Recommendations reviewed with pt as per Dr. Madireddy's note.  Pt verbalized understanding and had no additional questions. Pt declined EP referral.

## 2023-09-01 ENCOUNTER — Other Ambulatory Visit: Payer: Self-pay | Admitting: Cardiology

## 2023-09-06 ENCOUNTER — Other Ambulatory Visit: Payer: Self-pay | Admitting: Cardiology

## 2023-09-28 ENCOUNTER — Ambulatory Visit

## 2023-11-19 ENCOUNTER — Other Ambulatory Visit: Payer: Self-pay | Admitting: Cardiology

## 2023-12-22 ENCOUNTER — Telehealth: Payer: Self-pay

## 2023-12-22 NOTE — Telephone Encounter (Signed)
 Spoke with pt regarding monitor results and follow up appt. Sent to front desk to make appt.

## 2023-12-22 NOTE — Telephone Encounter (Signed)
 Pt called in stating he doesn't remember getting results from heart monitor back in July and asked if someone can call to go over it with him.

## 2023-12-24 ENCOUNTER — Ambulatory Visit

## 2023-12-24 NOTE — Progress Notes (Deleted)
 Cardiology Consultation:    Date:  12/24/2023   ID:  Clarence Larson, DOB [birth year], MRN [id number]  PCP:  Jackolyn Darice BROCKS, FNP  Cardiologist:  Alean SAUNDERS Mycheal Veldhuizen, MD   Referring MD: Jackolyn Darice BROCKS, FNP   No chief complaint on file.    ASSESSMENT AND PLAN:   Mr Maddix 58 year old male Problem List Items Addressed This Visit   None     History of Present Illness:    Clarence Larson is a 58 y.o. male who is being seen today for follow-up visit. PCP is [name], Darice BROCKS, FNP. Last visit with me in the office was 06/17/2023.  Prior to that was following up with Dr. Monetta.  Has history of coronary atherosclerosis  (pharmacological stress test Lexiscan  nuclear med Raford 03/07/2017 noted no ischemia), CHF with mildly reduced EF 40 to 45% December 2023 by echocardiogram [at [facility]] subsequently normalized with LVEF 64% on echocardiogram September 2024 and most recent echocardiogram May 17, 2023 at [facility] EF 60 to 65%, hypertension, diabetes mellitus type 2, hyperlipidemia, obstructive sleep apnea, TIA, erectile dysfunction, GERD.  Asymptomatic frequent PVCs 17% burden appear monomorphic RVOT origin [Zio patch 14-day study 06/17/2023; no patient triggered events; study done while he was on Nebivolol 10 mg once daily].     Past Medical History:  Diagnosis Date   Abnormal liver enzymes 01/22/2022   Formatting of this note might be different from the original. Noted 11/2021     Anxiety 01/22/2022   Atherosclerosis of both carotid arteries 01/22/2022   Formatting of this note might be different from the original. 30% RH 11/2021 Formatting of this note might be different from the original. 30% RH 11/2021     CAD (coronary artery disease) 02/19/2022   CHF (congestive heart failure) (HCC) 06/17/2023   Chronic coronary microvascular dysfunction    Chronic pain syndrome 11/22/2021   Diabetes mellitus with hyperglycemia (HCC) 07/15/2022   Essential  hypertension 07/31/2021   Frequent PVCs 06/17/2023   Gastroesophageal reflux disease without esophagitis 07/31/2021   History of CHF (congestive heart failure)    Hypertension    Hypertensive heart disease with heart failure (HCC) 06/17/2023   Mild persistent asthma without complication 07/31/2021   Mixed hyperlipidemia 07/31/2021   Preop cardiovascular exam 06/17/2023   TIA (transient ischemic attack)    Type 2 diabetes mellitus (HCC)    Type 2 diabetes mellitus without complication, without long-term current use of insulin  (HCC) 07/31/2021    Past Surgical History:  Procedure Laterality Date   ARTHROPLASTY     BRAIN SURGERY     NECK SURGERY     ROTATOR CUFF REPAIR      Current Medications: No outpatient medications have been marked as taking for the 12/24/23 encounter (Appointment) with Mykell Genao, Alean SAUNDERS, MD.     Allergies:   Atomoxetine, Esomeprazole magnesium, Hydrocodone, Nystatin, and Codeine   Social History   Socioeconomic History   Marital status: Single    Spouse name: Not on file   Number of children: Not on file   Years of education: Not on file   Highest education level: Not on file  Occupational History   Not on file  Tobacco Use   Smoking status: Never   Smokeless tobacco: Never  Vaping Use   Vaping status: Never Used  Substance and Sexual Activity   Alcohol  use: Yes    Alcohol /week: 3.0 - 4.0 standard drinks of alcohol     Types: 3 - 4 Cans of  beer per week   Drug use: No   Sexual activity: Yes  Other Topics Concern   Not on file  Social History Narrative   Not on file   Social Drivers of Health   Financial Resource Strain: Not on file  Food Insecurity: Low Risk  (08/18/2023)   Received from [facility]   Hunger Vital Sign    Within the past 12 months, you worried that your food would run out before you got money to buy more: Never true    Within the past 12 months, the food you bought just didn't last and you didn't have money to get  more. : Never true  Transportation Needs: No Transportation Needs (08/18/2023)   Received from [facility]    In the past 12 months, has lack of reliable transportation kept you from medical appointments, meetings, work or from getting things needed for daily living? : No  Physical Activity: Not on file  Stress: Not on file  Social Connections: Unknown (06/21/2021)   Received from [facility]   Social Network    Social Network: Not on file     Family History: The patient's family history includes Alzheimer's disease in his mother; Heart disease in his father; Testicular cancer in his brother. ROS:   Please see the history of present illness.    All 14 point review of systems negative except as described per history of present illness.  EKGs/Labs/Other Studies Reviewed:    The following studies were reviewed today:   EKG:       Recent Labs: No results found for requested labs within last 365 days.  Recent Lipid Panel No results found for: CHOL, TRIG, HDL, CHOLHDL, VLDL, LDLCALC, LDLDIRECT  Physical Exam:    VS:  There were no vitals taken for this visit.    Wt Readings from Last 3 Encounters:  06/17/23 248 lb (112.5 kg)  12/04/22 258 lb 6.4 oz (117.2 kg)  09/08/22 260 lb (117.9 kg)     GENERAL:  Well nourished, well developed in no acute distress NECK: No JVD; No carotid bruits CARDIAC: RRR, S1 and S2 present, no murmurs, no rubs, no gallops CHEST:  Clear to auscultation without rales, wheezing or rhonchi  Extremities: No pitting pedal edema. Pulses bilaterally symmetric with radial 2+ and dorsalis pedis 2+ NEUROLOGIC:  Alert and oriented x 3  Medication Adjustments/Labs and Tests Ordered: Current medicines are reviewed at length with the patient today.  Concerns regarding medicines are outlined above.  No orders of the defined types were placed in this encounter.  No orders of the defined types were placed in this  encounter.   Signed, Alean reddy Penny Arrambide, MD, MPH, [facility]. 12/24/2023 1:12 PM    [facility]

## 2024-01-23 NOTE — Progress Notes (Unsigned)
 Cardiology Consultation:    Date:  01/25/2024   ID:  Clarence Larson, DOB 06-02-1965, MRN [id number]  PCP:  Clarence Darice BROCKS, FNP  Cardiologist:  Clarence Leiter, MD   Referring MD: Clarence Darice BROCKS, FNP   No chief complaint on file.    ASSESSMENT AND PLAN:    [place] is doing well with his hypertensive heart disease and diastolic heart failure compensated on good medical therapy including as needed diuretic ARB beta-blocker with normalization of ejection fraction. He has frequent PVCs but is asymptomatic he will continue his beta-blocker and does not need an ICD antiarrhythmic drug Hypertension is well-controlled on current treatment continue beta-blocker ARB Continue his current lipid-lowering treatment with a high intensity statin From cardiology perspective optimized for his planned orthopedic surgery    History of Present Illness:    Clarence Larson is a 58 y.o. male who is being seen today for follow-up visit.  Last seen by me in October 2024 with a history of obstructive sleep apnea asthma heart failure initially reduced ejection fraction subsequently normalizing coronary artery calcification hypertensive heart disease with diastolic heart failure frequent PVCs type 2 diabetes and hyper lipidemia. His echo cardiogram in April of this year at [facility] 60 to 65%. He continues to have early morning chronic sputum production.  He attributes this to having a in the house. He is not having shortness of breath edema does not need his diuretic he has had no orthopnea chest pain palpitation or syncope He is pending total knee replacement right lower extremity  Recent lipid profile cholesterol 146 LDL 79 non-HDL cholesterol 107.  Past Medical History:  Diagnosis Date   Abnormal liver enzymes 01/22/2022   Formatting of this note might be different from the original. Noted 11/2021     Anxiety 01/22/2022   Atherosclerosis of both carotid arteries 01/22/2022   Formatting of  this note might be different from the original. 30% [facility] 11/2021 Formatting of this note might be different from the original. 30% [facility] 11/2021     CAD (coronary artery disease) 02/19/2022   CHF (congestive heart failure) (HCC) 06/17/2023   Chronic coronary microvascular dysfunction    Chronic pain syndrome 11/22/2021   Diabetes mellitus with hyperglycemia (HCC) 07/15/2022   Essential hypertension 07/31/2021   Frequent PVCs 06/17/2023   Gastroesophageal reflux disease without esophagitis 07/31/2021   History of CHF (congestive heart failure)    Hypertension    Hypertensive heart disease with heart failure (HCC) 06/17/2023   Mild persistent asthma without complication 07/31/2021   Mixed hyperlipidemia 07/31/2021   Preop cardiovascular exam 06/17/2023   TIA (transient ischemic attack)    Type 2 diabetes mellitus (HCC)    Type 2 diabetes mellitus without complication, without long-term current use of insulin  (HCC) 07/31/2021    Past Surgical History:  Procedure Laterality Date   ARTHROPLASTY     BRAIN SURGERY     NECK SURGERY     ROTATOR CUFF REPAIR      Current Medications: Current Meds  Medication Sig   albuterol  (VENTOLIN  HFA) 108 (90 Base) MCG/ACT inhaler Inhale 2 puffs into the lungs every 6 (six) hours as needed for wheezing or shortness of breath.   atorvastatin (LIPITOR) 10 MG tablet Take 1 tablet by mouth at bedtime.   Budeson-Glycopyrrol-Formoterol (BREZTRI AEROSPHERE) 160-9-4.8 MCG/ACT AERO Inhale 2 puffs into the lungs 2 (two) times daily.   clindamycin (CLEOCIN T) 1 % SWAB Apply topically 2 (two) times daily.   clopidogrel (PLAVIX) 75  MG tablet Take 75 mg by mouth daily.   dapagliflozin  propanediol (FARXIGA ) 10 MG TABS tablet Take 1 tablet (10 mg total) by mouth daily before breakfast.   famotidine (PEPCID) 20 MG tablet Take 20 mg by mouth daily.   fluticasone (FLONASE) 50 MCG/ACT nasal spray Place 2 sprays into both nostrils daily.   furosemide  (LASIX ) 40 MG tablet Take  1 tablet (40 mg total) by mouth daily.   hydrOXYzine (ATARAX) 25 MG tablet Take 25 mg by mouth 2 (two) times daily.   ipratropium-albuterol  (DUONEB) 0.5-2.5 (3) MG/3ML SOLN Take 3 mLs by nebulization every 6 (six) hours as needed (wheezing or shortness of breath).   loratadine (CLARITIN) 10 MG tablet Take 10 mg by mouth daily.   metFORMIN (GLUCOPHAGE) 500 MG tablet Take 500 mg by mouth 2 (two) times daily.   montelukast (SINGULAIR) 10 MG tablet Take 10 mg by mouth at bedtime.   Multiple Vitamin (MULTIVITAMIN) capsule Take 1 capsule by mouth daily.   nebivolol (BYSTOLIC) 10 MG tablet Take 10 mg by mouth daily.   ondansetron  (ZOFRAN ) 8 MG tablet Take 8 mg by mouth every 8 (eight) hours as needed.   oxycodone  (ROXICODONE ) 30 MG immediate release tablet Take 30 mg by mouth every 4 (four) hours as needed for pain.   pantoprazole  (PROTONIX ) 40 MG tablet Take 40 mg by mouth daily.   tadalafil (CIALIS) 20 MG tablet Take 20 mg by mouth daily as needed for erectile dysfunction.   testosterone  cypionate (DEPOTESTOSTERONE CYPIONATE) 200 MG/ML injection Inject 200 mg into the muscle once a week.   tirzepatide (MOUNJARO) 5 MG/0.5ML Pen Inject 5 mg into the skin once a week.   valsartan  (DIOVAN ) 80 MG tablet Take 1 tablet (80 mg total) by mouth 2 (two) times daily.     Allergies:   Atomoxetine, Cyclobenzaprine , Esomeprazole, Esomeprazole magnesium, Gabapentin, Hydrocodone, Nystatin, and Codeine   Social History   Socioeconomic History   Marital status: Single    Spouse name: Not on file   Number of children: Not on file   Years of education: Not on file   Highest education level: Not on file  Occupational History   Not on file  Tobacco Use   Smoking status: Never   Smokeless tobacco: Never  Vaping Use   Vaping status: Never Used  Substance and Sexual Activity   Alcohol  use: Yes    Alcohol /week: 3.0 - 4.0 standard drinks of alcohol     Types: 3 - 4 Cans of beer per week   Drug use: No   Sexual  activity: Yes  Other Topics Concern   Not on file  Social History Narrative   Not on file   Social Drivers of Health   Tobacco Use: Low Risk (01/25/2024)   Patient History    Smoking Tobacco Use: Never    Smokeless Tobacco Use: Never    Passive Exposure: Not on file  Financial Resource Strain: Not on file  Food Insecurity: Low Risk (08/18/2023)   Received from [facility]   Epic    Within the past 12 months, you worried that your food would run out before you got money to buy more: Never true    Within the past 12 months, the food you bought just didn't last and you didn't have money to get more. : Never true  Transportation Needs: No Transportation Needs (08/18/2023)   Received from [facility]    In the past 12 months, has lack of reliable transportation  kept you from medical appointments, meetings, work or from getting things needed for daily living? : No  Physical Activity: Not on file  Stress: Not on file  Social Connections: Not on file  Depression (EYV7-0): Not on file  Alcohol  Screen: Not on file  Housing: Low Risk (08/18/2023)   Received from [facility]   Epic    What is your living situation today?: I have a steady place to live    Think about the place you live. Do you have problems with any of the following? Choose all that apply:: None/None on this list  Utilities: Low Risk (08/18/2023)   Received from [facility]   Utilities    In the past 12 months has the electric, gas, oil, or water company threatened to shut off services in your home? : No  Health Literacy: Not on file     Family History: The patient's family history includes Alzheimer's disease in his mother; Heart disease in his father; Testicular cancer in his brother. ROS:   Please see the history of present illness.    All 14 point review of systems negative except as described per history of present illness.  EKGs/Labs/Other Studies Reviewed:    The following studies were  reviewed today: He had an EKG this calendar year with frequent PVCs and bigeminy.  His event monitor showed frequent PVCs burden 17%   Physical Exam:    VS:  BP 134/64   Pulse 82   Ht 5' 11 (1.803 m)   Wt 248 lb 6 oz (112.7 kg)   SpO2 94%   BMI 34.64 kg/m     Wt Readings from Last 3 Encounters:  01/25/24 248 lb 6 oz (112.7 kg)  06/17/23 248 lb (112.5 kg)  12/04/22 258 lb 6.4 oz (117.2 kg)     GENERAL:  Well nourished, well developed in no acute distress NECK: No JVD; No carotid bruits CARDIAC: RRR, S1 and S2 present, no murmurs, no rubs, no gallops CHEST:  Clear to auscultation without rales, wheezing or rhonchi  Extremities: No pitting pedal edema. Pulses bilaterally symmetric with radial 2+ and dorsalis pedis 2+ NEUROLOGIC:  Alert and oriented x 3  Medication Adjustments/Labs and Tests Ordered: Current medicines are reviewed at length with the patient today.  Concerns regarding medicines are outlined above.  No orders of the defined types were placed in this encounter.  No orders of the defined types were placed in this encounter.   Signed, Alean jess Kobus, MD, MPH, [facility]. 01/25/2024 11:34 AM    [facility]

## 2024-01-25 ENCOUNTER — Ambulatory Visit: Attending: Cardiology | Admitting: Cardiology

## 2024-01-25 ENCOUNTER — Encounter: Payer: Self-pay | Admitting: Cardiology

## 2024-01-25 VITALS — BP 134/64 | HR 82 | Ht 71.0 in | Wt 248.4 lb

## 2024-01-25 DIAGNOSIS — I2510 Atherosclerotic heart disease of native coronary artery without angina pectoris: Secondary | ICD-10-CM

## 2024-01-25 DIAGNOSIS — I110 Hypertensive heart disease with heart failure: Secondary | ICD-10-CM

## 2024-01-25 DIAGNOSIS — E782 Mixed hyperlipidemia: Secondary | ICD-10-CM | POA: Diagnosis not present

## 2024-01-25 DIAGNOSIS — E119 Type 2 diabetes mellitus without complications: Secondary | ICD-10-CM | POA: Diagnosis not present

## 2024-01-25 NOTE — Patient Instructions (Signed)
 Medication Instructions:  Your physician recommends that you continue on your current medications as directed. Please refer to the Current Medication list given to you today.\ *If you need a refill on your cardiac medications before your next appointment, please call your pharmacy*  Lab Work: None If you have labs (blood work) drawn today and your tests are completely normal, you will receive your results only by: MyChart Message (if you have MyChart) OR A paper copy in the mail If you have any lab test that is abnormal or we need to change your treatment, we will call you to review the results.  Testing/Procedures: None  Follow-Up: At [facility], you and your health needs are our priority.  As part of our continuing mission to provide you with exceptional heart care, our providers are all part of one team.  This team includes your primary Cardiologist (physician) and Advanced Practice Providers or APPs (Physician Assistants and Nurse Practitioners) who all work together to provide you with the care you need, when you need it.  Your next appointment:   1 year(s)  Provider:   Redell Leiter, MD    We recommend signing up for the patient portal called MyChart.  Sign up information is provided on this After Visit Summary.  MyChart is used to connect with patients for Virtual Visits (Telemedicine).  Patients are able to view lab/test results, encounter notes, upcoming appointments, etc.  Non-urgent messages can be sent to your provider as well.   To learn more about what you can do with MyChart, go to [email or web address].   Other Instructions Advised to purchase a [employer] air purifier/ filter for your bedroom

## 2024-02-08 ENCOUNTER — Telehealth: Payer: Self-pay

## 2024-02-08 NOTE — Telephone Encounter (Signed)
"  ° °  Pre-operative Risk Assessment    Patient Name: Clarence Larson  DOB: 09/10/1965 MRN: [id number]   Date of last office visit: 01/25/2024 Date of next office visit: N/A   Request for Surgical Clearance    Procedure:  Right total knee arthroplasty  Date of Surgery:  Clearance TBD                                Surgeon:  Dr. Juliane Boer Surgeon's Group or Practice Name:  [facility]  Phone number:  [phone] Fax number:  [phone]   Type of Clearance Requested:   - Pharmacy:  Hold Clopidogrel (Plavix) please advise   Type of Anesthesia:  General    Additional requests/questions:    Clarence Larson   02/08/2024, 1:17 PM   "

## 2024-02-08 NOTE — Telephone Encounter (Signed)
"  ° °  Patient Name: Clarence Larson  DOB: 1965/08/24 MRN: [id number]  Primary Cardiologist: Dr.Munley  Chart reviewed as part of pre-operative protocol coverage. Given past medical history and time since last visit, based on ACC/AHA guidelines, Clarence Larson is at acceptable risk for the planned procedure without further cardiovascular testing.   Per Dr. Monetta 03/11/2023 Clarence Larson is doing well with his hypertensive heart disease and diastolic heart failure compensated on good medical therapy including as needed diuretic ARB beta-blocker with normalization of ejection fraction. He has frequent PVCs but is asymptomatic he will continue his beta-blocker and does not need an ICD antiarrhythmic drug Hypertension is well-controlled on current treatment continue beta-blocker ARB Continue his current lipid-lowering treatment with a high intensity statin From cardiology perspective optimized for his planned orthopedic surgery  Per office protocol, if patient is without any new symptoms or concerns at the time of their virtual visit, he may hold Plavix for 5 days prior to procedure. Please resume Plavix as soon as possible postprocedure, at the discretion of the surgeon.    The patient was advised that if he develops new symptoms prior to surgery to contact our office to arrange for a follow-up visit, and he verbalized understanding.  I will route this recommendation to the requesting party via Epic fax function and remove from pre-op pool.  Please call with questions.  Lamarr Satterfield, NP 02/08/2024, 1:21 PM  "

## 2024-02-19 DIAGNOSIS — Z01810 Encounter for preprocedural cardiovascular examination: Secondary | ICD-10-CM | POA: Diagnosis not present
